# Patient Record
Sex: Female | Born: 1955 | Race: Black or African American | Marital: Single | Smoking: Never smoker
Health system: Southern US, Community
[De-identification: ages and names within clinical notes are randomized; demographics above are authoritative.]

## PROBLEM LIST (undated history)

## (undated) DIAGNOSIS — E119 Type 2 diabetes mellitus without complications: Secondary | ICD-10-CM

## (undated) DIAGNOSIS — I10 Essential (primary) hypertension: Secondary | ICD-10-CM

## (undated) HISTORY — PX: ECTOPIC PREGNANCY SURGERY: SHX613

---

## 2000-11-27 ENCOUNTER — Ambulatory Visit (HOSPITAL_COMMUNITY): Admission: RE | Admit: 2000-11-27 | Discharge: 2000-11-27 | Payer: Self-pay | Admitting: *Deleted

## 2009-06-27 IMAGING — US MAMMO-LUNI-US
1 series · 14 of 16 positions shown · non-contrast
Comparison: NONE

CLINICAL DATA: Kabirov, [facility] Diagnostic Mammogram. 

LEFT BREAST MAMMOGRAM ADDITIONAL VIEWS AND LEFT BREAST ULTRASOUND

[Series 1: us breast · 0.09mm/px · 14 of 17 slices shown]
[im 1/17]
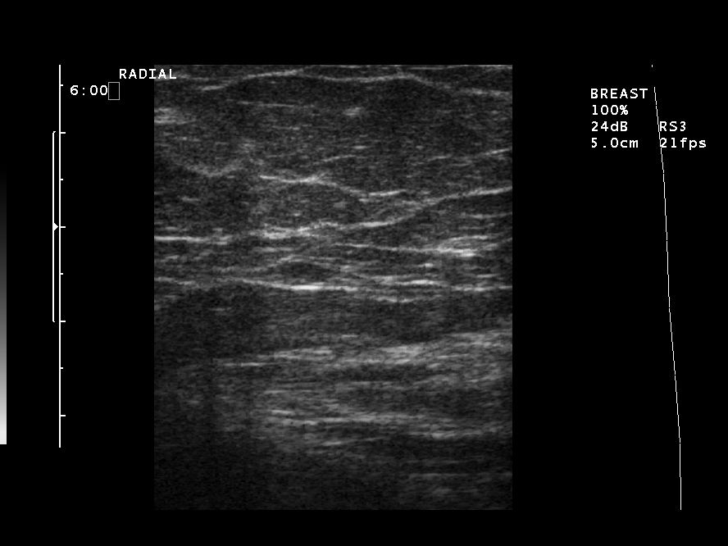
[im 2/17]
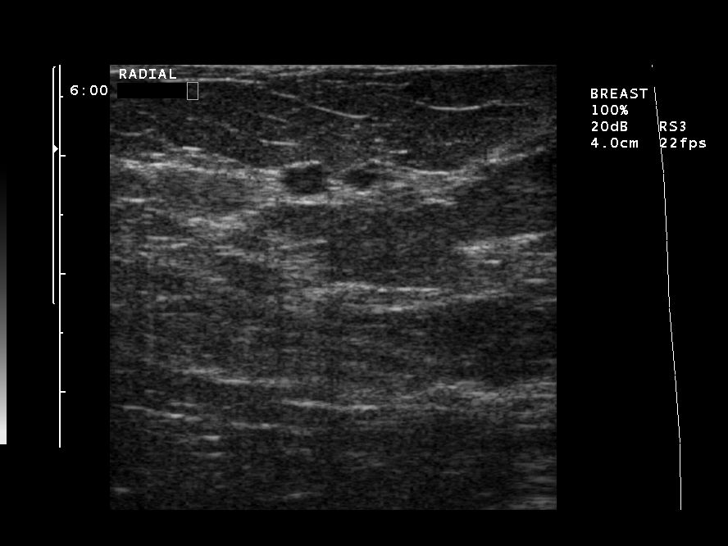
[im 3/17]
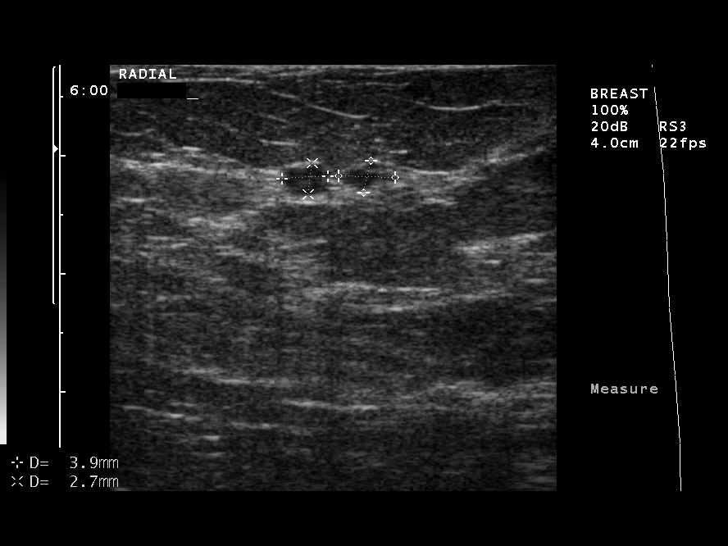
[im 5/17]
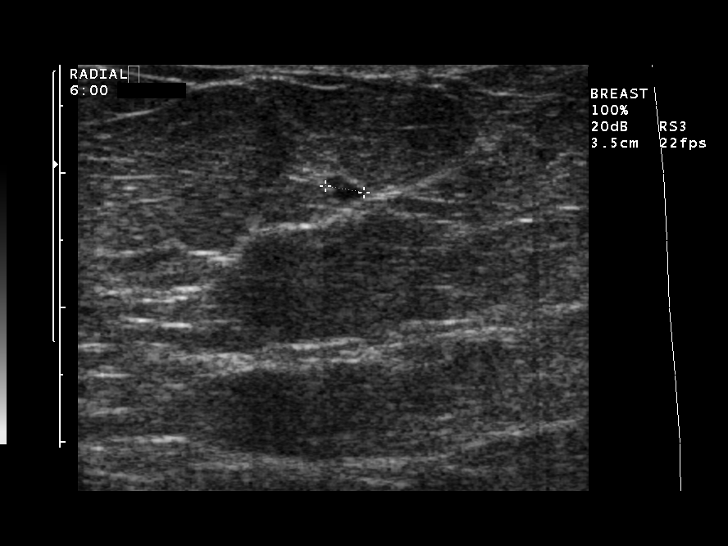
[im 6/17]
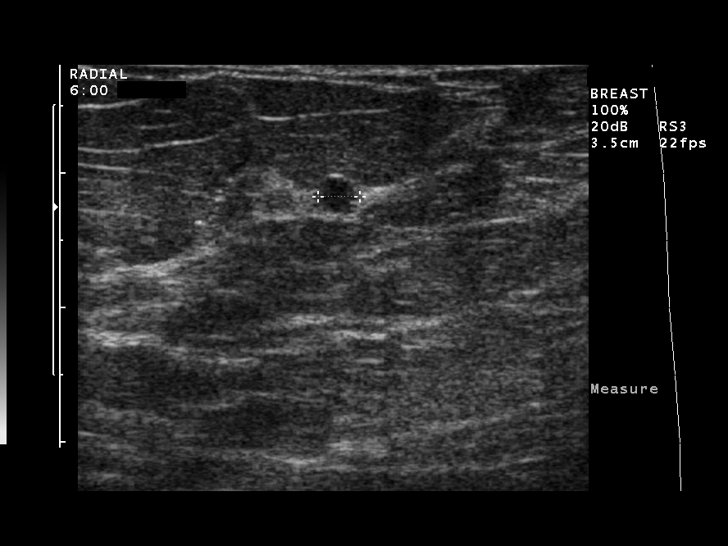
[im 7/17]
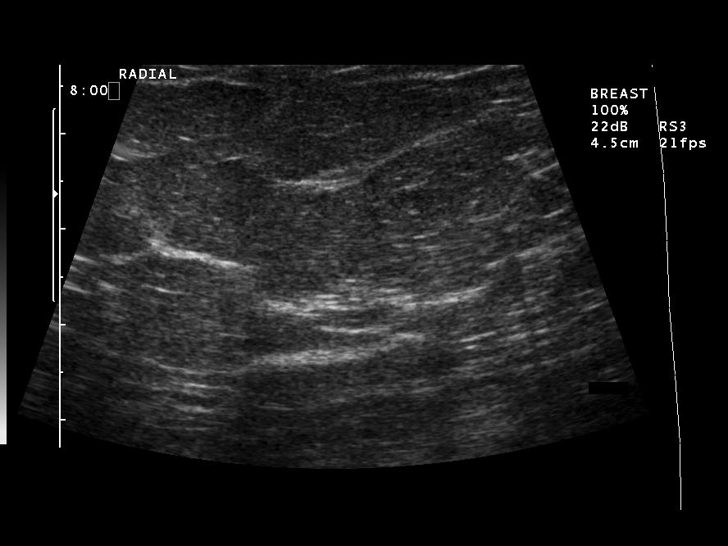
[im 8/17]
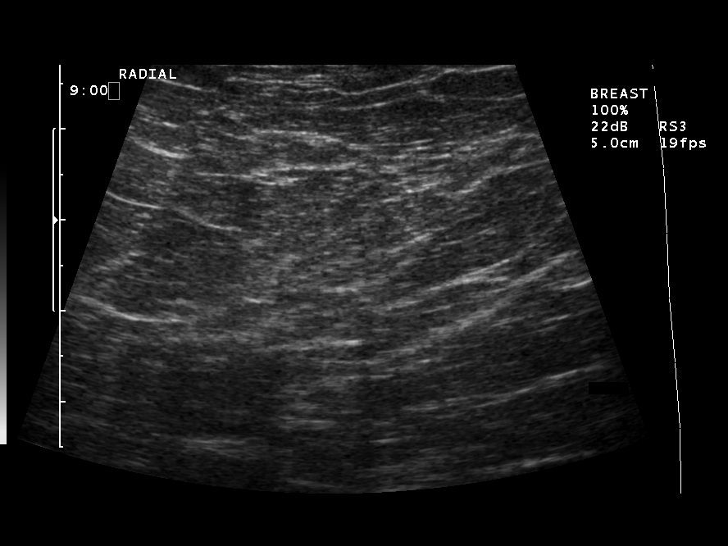
[im 9/17]
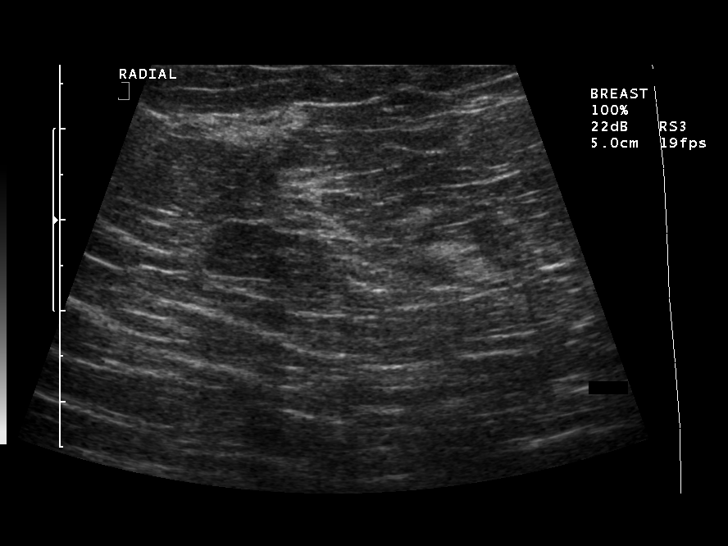
[im 10/17]
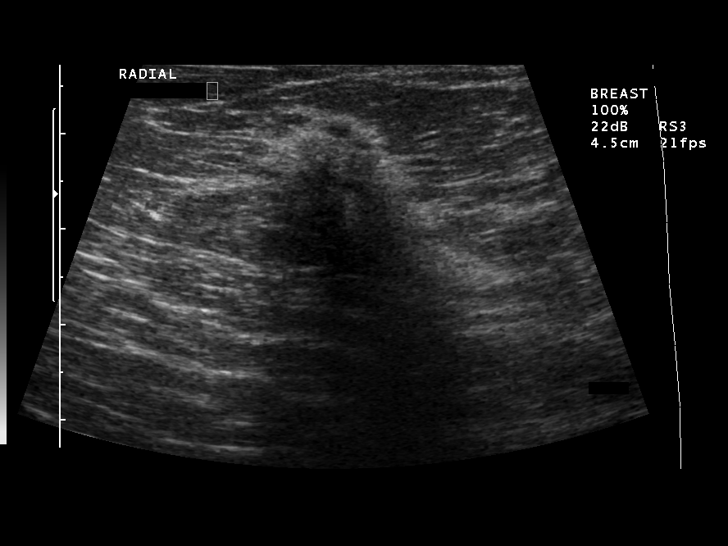
[im 11/17]
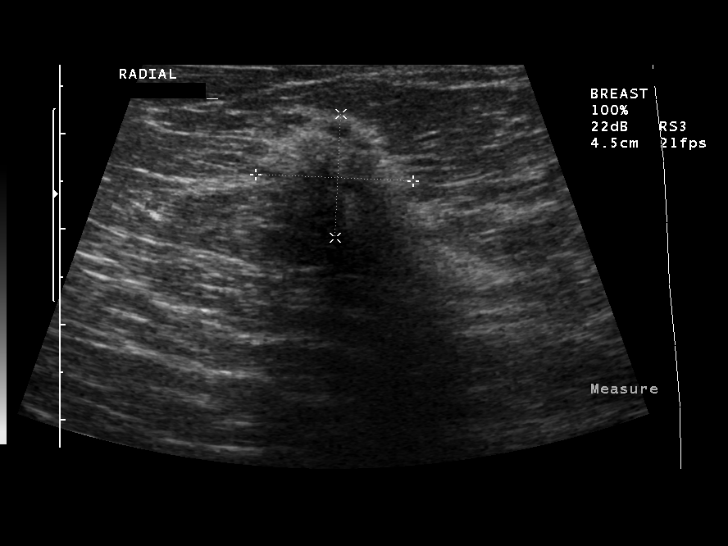
[im 13/17]
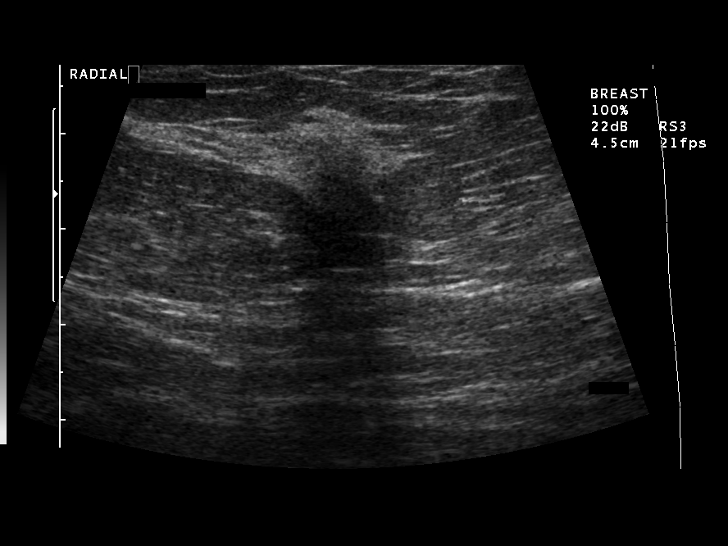
[im 14/17]
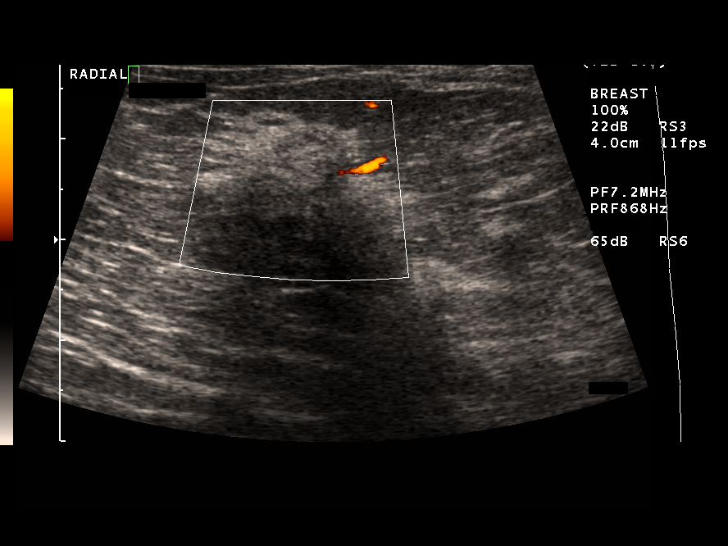
[im 15/17]
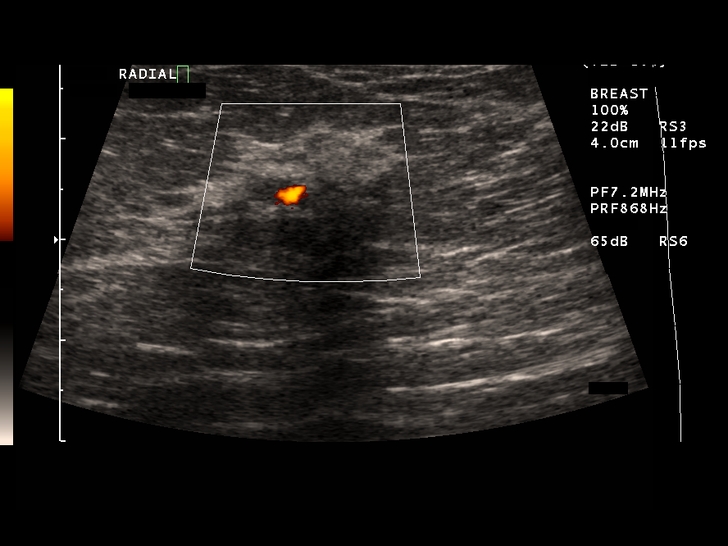
[im 17/17]
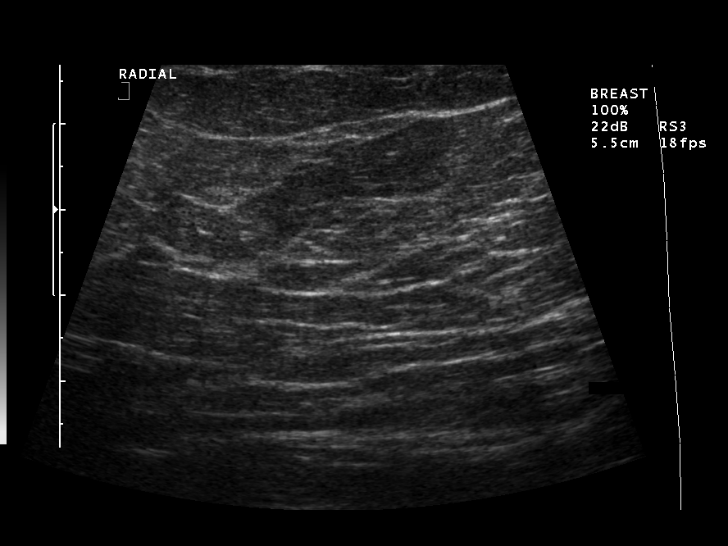

[14 of 16 positions shown; findings below may reference images not displayed]

FINDINGS: Spot compression of the left breast in the CC projection 
and true lateral view of the left breast demonstrates two 
persistent areas of fibroglandular asymmetry both in the upper 
inner quadrant, one anterior and one posterior and slightly more 
lateral. Ultrasound demonstrates a hypoechoic area in the [DATE] 
position suspicious for neoplasm.  It is difficult to ascertain 
the mammographic correlate although it is more likely the more 
anterior and medial of the two lesions.
IMPRESSION: Ultrasound finding suspicious for malignancy.  
Recommend ultrasound-guided biopsy for further evaluation. 
Computer assisted (Second Look) technology was used as an aid in 
interpretation of this study. The patient was informed at the time 
of the examination of the findings and recommendations by verbal 
and written lay report. BI-RADS 4:  Suspicious abnormality ??? 
electronically reviewed on 10/17/2007 Dict Date: 10/17/2007  Tran

## 2010-11-20 ENCOUNTER — Encounter: Payer: Self-pay | Admitting: Internal Medicine

## 2015-04-23 ENCOUNTER — Encounter (HOSPITAL_COMMUNITY): Payer: Self-pay | Admitting: Emergency Medicine

## 2015-04-23 ENCOUNTER — Emergency Department (HOSPITAL_COMMUNITY)
Admission: EM | Admit: 2015-04-23 | Discharge: 2015-04-23 | Disposition: A | Discharge status: Home / Self Care | Payer: No Typology Code available for payment source | Attending: Emergency Medicine | Admitting: Emergency Medicine

## 2015-04-23 ENCOUNTER — Emergency Department (HOSPITAL_COMMUNITY): Payer: No Typology Code available for payment source

## 2015-04-23 DIAGNOSIS — Z79899 Other long term (current) drug therapy: Secondary | ICD-10-CM | POA: Diagnosis not present

## 2015-04-23 DIAGNOSIS — R109 Unspecified abdominal pain: Secondary | ICD-10-CM | POA: Diagnosis not present

## 2015-04-23 DIAGNOSIS — I10 Essential (primary) hypertension: Secondary | ICD-10-CM | POA: Diagnosis not present

## 2015-04-23 DIAGNOSIS — E119 Type 2 diabetes mellitus without complications: Secondary | ICD-10-CM | POA: Insufficient documentation

## 2015-04-23 DIAGNOSIS — R42 Dizziness and giddiness: Secondary | ICD-10-CM | POA: Insufficient documentation

## 2015-04-23 DIAGNOSIS — R112 Nausea with vomiting, unspecified: Secondary | ICD-10-CM | POA: Diagnosis present

## 2015-04-23 HISTORY — DX: Essential (primary) hypertension: I10

## 2015-04-23 HISTORY — DX: Type 2 diabetes mellitus without complications: E11.9

## 2015-04-23 LAB — COMPREHENSIVE METABOLIC PANEL
ALT: 17 U/L (ref 14–54)
AST: 19 U/L (ref 15–41)
Albumin: 4.1 g/dL (ref 3.5–5.0)
Alkaline Phosphatase: 74 U/L (ref 38–126)
Anion gap: 7 (ref 5–15)
BUN: 15 mg/dL (ref 6–20)
CO2: 27 mmol/L (ref 22–32)
Calcium: 10.3 mg/dL (ref 8.9–10.3)
Chloride: 104 mmol/L (ref 101–111)
Creatinine, Ser: 0.9 mg/dL (ref 0.44–1.00)
GFR calc Af Amer: >60 mL/min (ref >60)
GFR calc non Af Amer: >60 mL/min (ref >60)
Glucose, Bld: 126 mg/dL — ABNORMAL HIGH (ref 65–99)
Potassium: 4.4 mmol/L (ref 3.5–5.1)
Sodium: 138 mmol/L (ref 135–145)
Total Bilirubin: 0.7 mg/dL (ref 0.3–1.2)
Total Protein: 7.6 g/dL (ref 6.5–8.1)

## 2015-04-23 LAB — CBC WITH DIFFERENTIAL/PLATELET
Basophils Absolute: 0 10*3/uL (ref 0.0–0.1)
Basophils Relative: 0 % (ref 0–1)
Eosinophils Absolute: 0 10*3/uL (ref 0.0–0.7)
Eosinophils Relative: 1 % (ref 0–5)
HCT: 38.8 % (ref 36.0–46.0)
Hemoglobin: 12.6 g/dL (ref 12.0–15.0)
Lymphocytes Relative: 29 % (ref 12–46)
Lymphs Abs: 1.7 10*3/uL (ref 0.7–4.0)
MCH: 29.9 pg (ref 26.0–34.0)
MCHC: 32.5 g/dL (ref 30.0–36.0)
MCV: 91.9 fL (ref 78.0–100.0)
Monocytes Absolute: 0.4 10*3/uL (ref 0.1–1.0)
Monocytes Relative: 6 % (ref 3–12)
Neutro Abs: 3.9 10*3/uL (ref 1.7–7.7)
Neutrophils Relative %: 64 % (ref 43–77)
Platelets: 242 10*3/uL (ref 150–400)
RBC: 4.22 MIL/uL (ref 3.87–5.11)
RDW: 13.7 % (ref 11.5–15.5)
WBC: 6 10*3/uL (ref 4.0–10.5)

## 2015-04-23 LAB — LIPASE, BLOOD: LIPASE: 22 U/L (ref 22–51)

## 2015-04-23 LAB — URINALYSIS, ROUTINE W REFLEX MICROSCOPIC
Bilirubin Urine: NEGATIVE
GLUCOSE, UA: NEGATIVE mg/dL
HGB URINE DIPSTICK: NEGATIVE
Ketones, ur: NEGATIVE mg/dL
LEUKOCYTES UA: NEGATIVE
Nitrite: NEGATIVE
PH: 7 (ref 5.0–8.0)
Protein, ur: NEGATIVE mg/dL
SPECIFIC GRAVITY, URINE: 1.018 (ref 1.005–1.030)
Urobilinogen, UA: 0.2 mg/dL (ref 0.0–1.0)

## 2015-04-23 MED ORDER — MECLIZINE HCL 25 MG PO TABS
25.0000 mg | ORAL_TABLET | Freq: Once | ORAL | Status: AC
  Administered 2015-04-23: 25 mg via ORAL
  Filled 2015-04-23: qty 1

## 2015-04-23 MED ORDER — SODIUM CHLORIDE 0.9 % IV BOLUS (SEPSIS)
1000.0000 mL | Freq: Once | INTRAVENOUS | Status: AC
  Administered 2015-04-23: 1000 mL via INTRAVENOUS

## 2015-04-23 MED ORDER — MECLIZINE HCL 50 MG PO TABS: 25.0000 mg | ORAL_TABLET | Freq: Three times a day (TID) | ORAL | Status: DC | PRN

## 2015-04-23 MED ORDER — PROMETHAZINE HCL 25 MG PO TABS: 25.0000 mg | ORAL_TABLET | Freq: Four times a day (QID) | ORAL | Status: DC | PRN

## 2015-04-23 MED ORDER — LORAZEPAM 1 MG PO TABS
1.0000 mg | ORAL_TABLET | Freq: Once | ORAL | Status: AC
  Administered 2015-04-23: 1 mg via ORAL
  Filled 2015-04-23: qty 1

## 2015-04-23 NOTE — ED Notes (Signed)
Pt reports dizziness, sts "the whole room is spinning", also sts she had n/v earlier today which has now resolved. Pt denies pain.

## 2015-04-23 NOTE — ED Notes (Signed)
Per EMS: Pt from home. C/o NV when she moves her head back and forth.

## 2015-04-23 NOTE — ED Provider Notes (Signed)
CSN: [id number]     Arrival date & time 04/23/15  1413 History   First MD Initiated Contact with Patient 04/23/15 1739     Chief Complaint  Patient presents with  . Abdominal Pain  . Emesis     (Consider location/radiation/quality/duration/timing/severity/associated sxs/prior Treatment) HPI..... Sense of room spinning earlier today with associated nausea. No other neurological deficits. No extremity weakness or numbness. No facial asymmetry. Normal mentation and conversation. No fever, chills, stiff neck. Severity is moderate. Movement makes symptoms worse. Patient is diabetic and takes oral hypoglycemics  Past Medical History  Diagnosis Date  . Diabetes mellitus without complication   . Hypertension    No past surgical history on file. No family history on file. History  Substance Use Topics  . Smoking status: Never Smoker   . Smokeless tobacco: Not on file  . Alcohol Use: No   OB History    No data available     Review of Systems  All other systems reviewed and are negative.     Allergies  Review of patient's allergies indicates no known allergies.  Home Medications   Prior to Admission medications   Medication Sig Start Date End Date Taking? Authorizing Provider  allopurinol (ZYLOPRIM) 300 MG tablet Take 1 tablet by mouth daily. 02/25/15  Yes Historical Provider, MD  HYDROcodone-acetaminophen (NORCO) 7.5-325 MG per tablet Take 1 tablet by mouth 2 (two) times daily as needed. pain 03/09/15  Yes Historical Provider, MD  ibuprofen (ADVIL,MOTRIN) 200 MG tablet Take 200-400 mg by mouth every 6 (six) hours as needed for moderate pain.   Yes Historical Provider, MD  metFORMIN (GLUCOPHAGE) 500 MG tablet Take 1 tablet by mouth daily. 02/25/15  Yes Historical Provider, MD  pantoprazole (PROTONIX) 40 MG tablet Take 1 tablet by mouth daily. 03/09/15  Yes Historical Provider, MD  TRIBENZOR 40-5-25 MG TABS Take 1 tablet by mouth daily. 03/11/15  Yes Historical Provider, MD  CRESTOR  40 MG tablet Take 1 tablet by mouth daily. 03/09/15   Historical Provider, MD  meclizine (ANTIVERT) 50 MG tablet Take 0.5 tablets (25 mg total) by mouth 3 (three) times daily as needed for dizziness or nausea. 04/23/15   Nat Christen, MD  promethazine (PHENERGAN) 25 MG tablet Take 1 tablet (25 mg total) by mouth every 6 (six) hours as needed for nausea or vomiting. 04/23/15   Nat Christen, MD   BP 136/69 mmHg  Pulse 65  Temp(Src) 98.5 F (36.9 C) (Oral)  Resp 18  SpO2 100% Physical Exam  Constitutional: She is oriented to person, place, and time. She appears well-developed and well-nourished.  HENT:  Head: Normocephalic and atraumatic.  Eyes: Conjunctivae and EOM are normal. Pupils are equal, round, and reactive to light.  Neck: Normal range of motion. Neck supple.  Cardiovascular: Normal rate and regular rhythm.   Pulmonary/Chest: Effort normal and breath sounds normal.  Abdominal: Soft. Bowel sounds are normal.  Musculoskeletal: Normal range of motion.  Neurological: She is alert and oriented to person, place, and time.  Skin: Skin is warm and dry.  Psychiatric: She has a normal mood and affect. Her behavior is normal.  Nursing note and vitals reviewed.   ED Course  Procedures (including critical care time) Labs Review Labs Reviewed  COMPREHENSIVE METABOLIC PANEL - Abnormal; Notable for the following:    Glucose, Bld 126 (*)    All other components within normal limits  CBC WITH DIFFERENTIAL/PLATELET  LIPASE, BLOOD  URINALYSIS, ROUTINE W REFLEX MICROSCOPIC (NOT AT [facility])  Imaging Review Ct Head Wo Contrast  04/23/2015   CLINICAL DATA:  Dizziness.  EXAM: CT HEAD WITHOUT CONTRAST  TECHNIQUE: Contiguous axial images were obtained from the base of the skull through the vertex without intravenous contrast.  COMPARISON:  None.  FINDINGS: No acute cortical infarct, hemorrhage, or mass lesion ispresent. Ventricles are of normal size. No significant extra-axial fluid collection is present.  The paranasal sinuses andmastoid air cells are clear. The osseous skull is intact.  IMPRESSION: 1. No acute intracranial abnormalities.  Normal brain.   Electronically Signed   By: Kerby Moors M.D.   On: 04/23/2015 20:18     EKG Interpretation None      MDM   Final diagnoses:  Vertigo    Patient appears to have a normal physical exam.  History consistent with vertigo. She feels better after IV fluids, Ativan, meclizine. Discharge medications Phenergan 25 mg and meclizine 25 mg    Nat Christen, MD 04/23/15 2253

## 2015-04-23 NOTE — Discharge Instructions (Signed)
Medication for dizziness and nausea. Increase fluids. Rest. Follow-up your primary care doctor.

## 2017-01-03 IMAGING — CT CT HEAD W/O CM
2 series · 17 of 30 positions shown, 20 images · non-contrast
Comparison: None.

CLINICAL DATA: Dizziness.

EXAM:
CT HEAD WITHOUT CONTRAST
TECHNIQUE: Contiguous axial images were obtained from the base of the skull
through the vertex without intravenous contrast.

[Series 2: head w/o · axial · non-contrast · 0.38mm/px · z∈[+617,+737]mm · 9 of 31 slices shown, 12 images]
[im 4/31  brain]
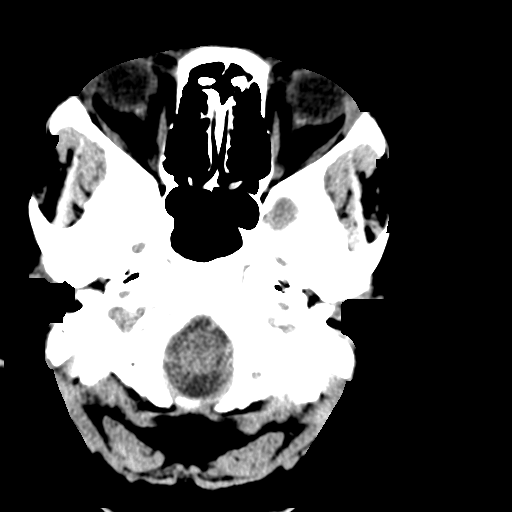
[im 4/31  bone]
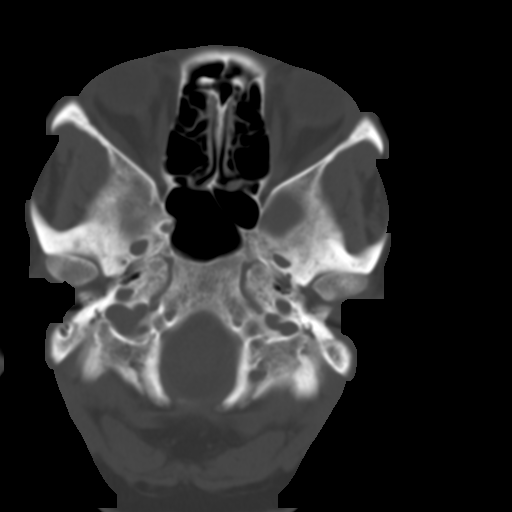
[im 7/31  brain]
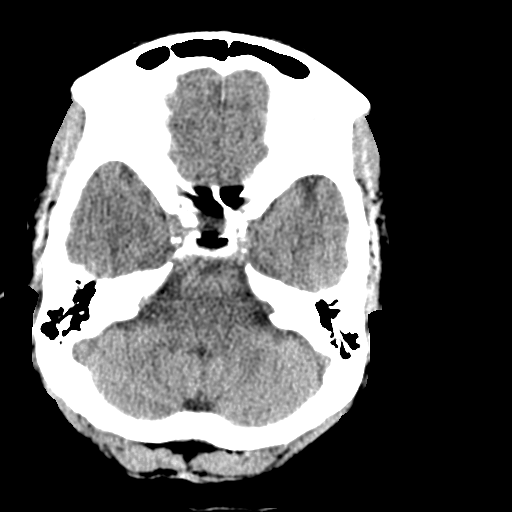
[im 10/31  brain]
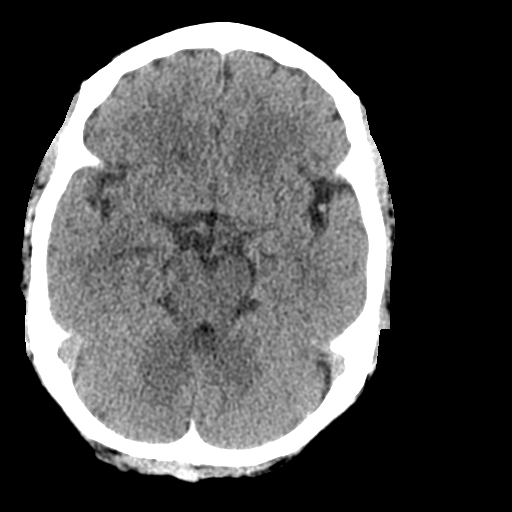
[im 13/31  brain]
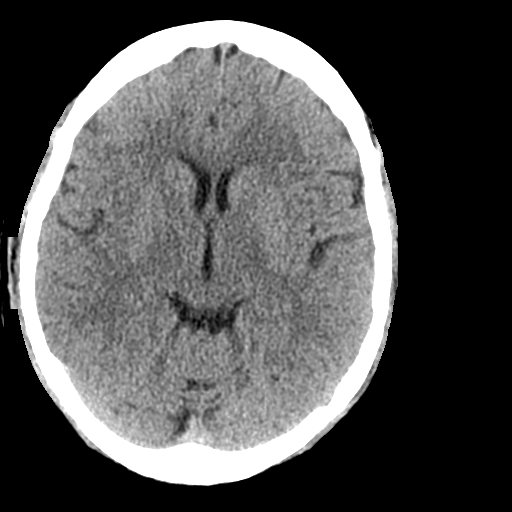
[im 16/31  brain]
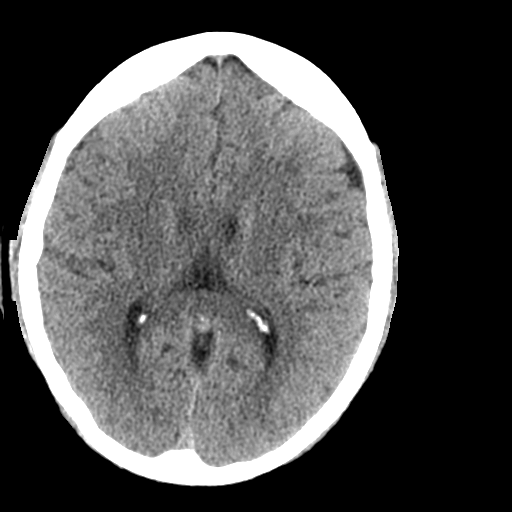
[im 16/31  bone]
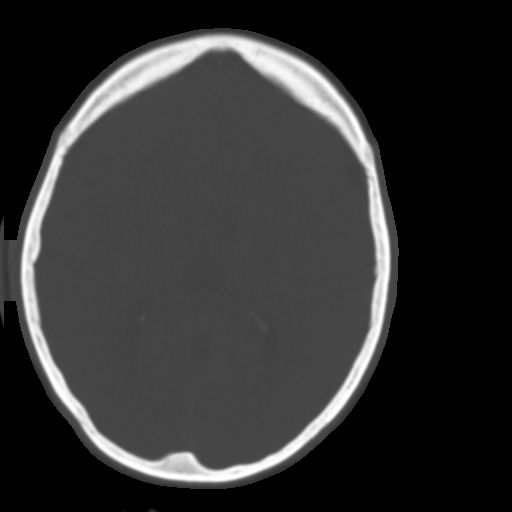
[im 19/31  brain]
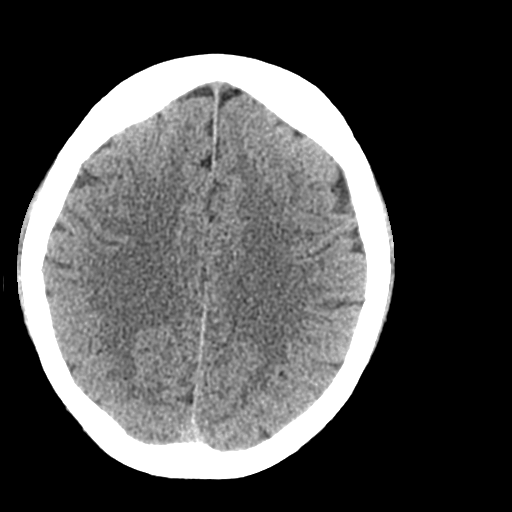
[im 22/31  brain]
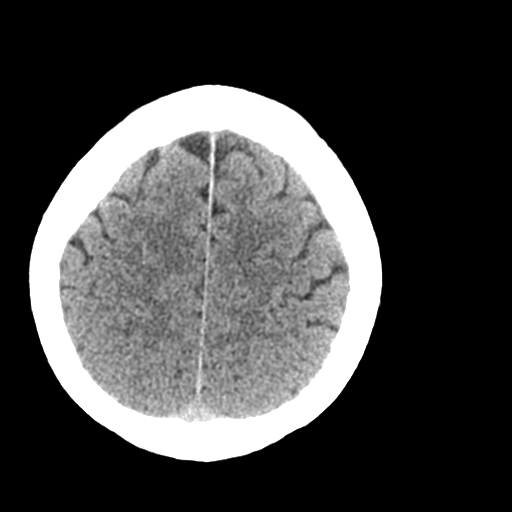
[im 25/31  brain]
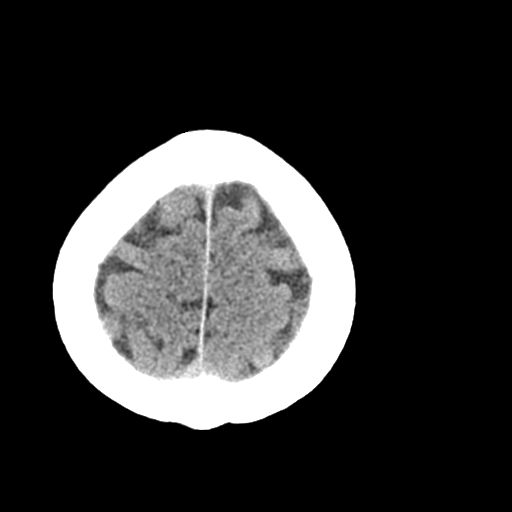
[im 28/31  brain]
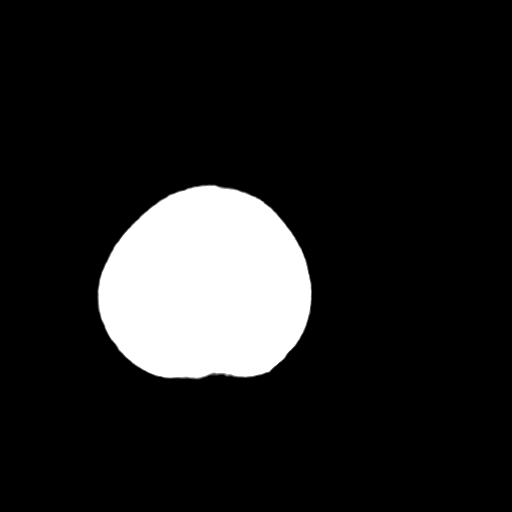
[im 28/31  bone]
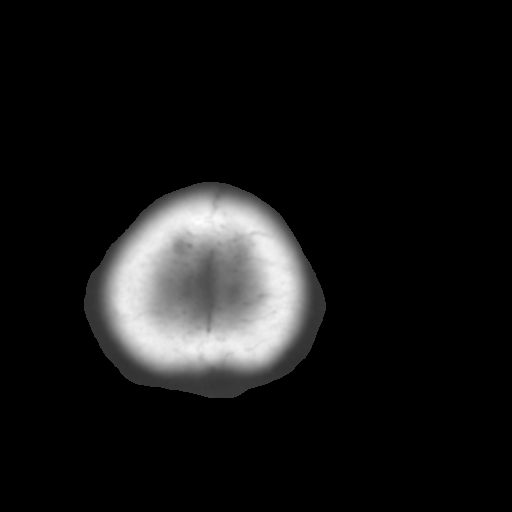

[Series 3: bone windows · axial · 0.38mm/px · z∈[+617,+734]mm · 8 of 51 slices shown]
[im 6/51  bone]
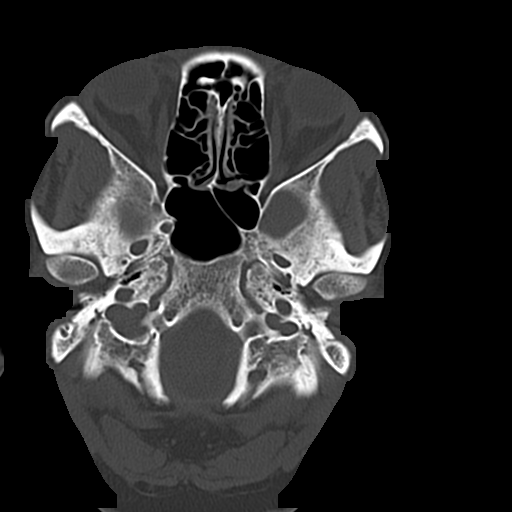
[im 12/51  bone]
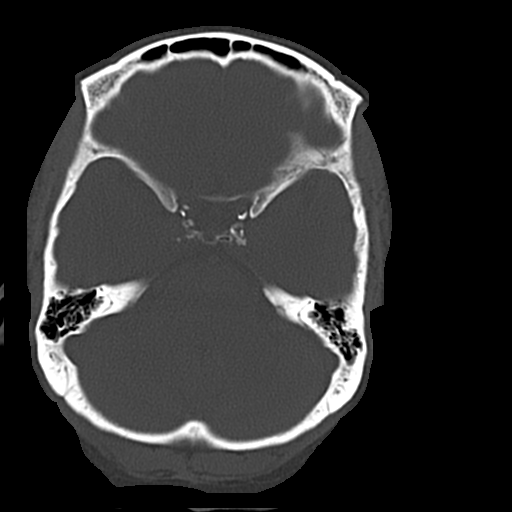
[im 17/51  bone]
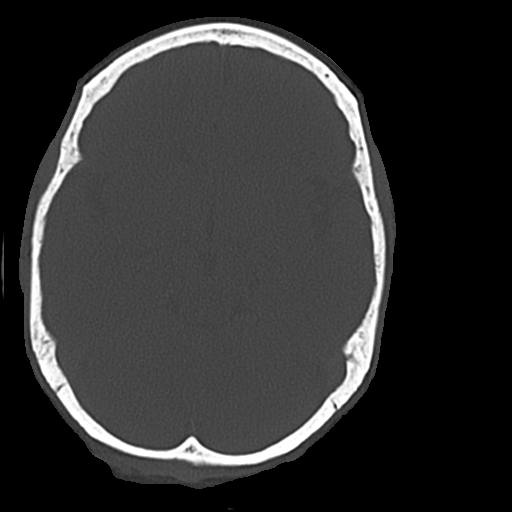
[im 23/51  bone]
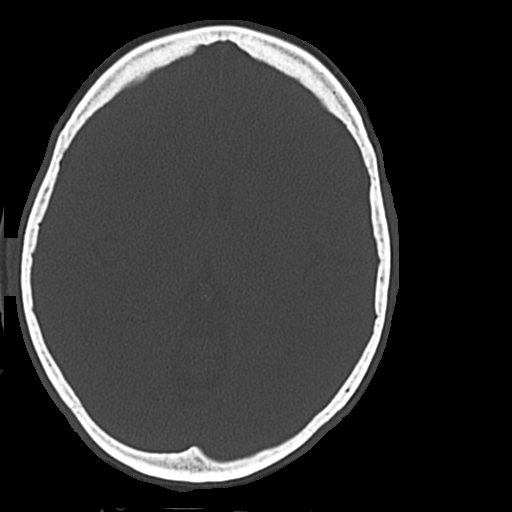
[im 28/51  bone]
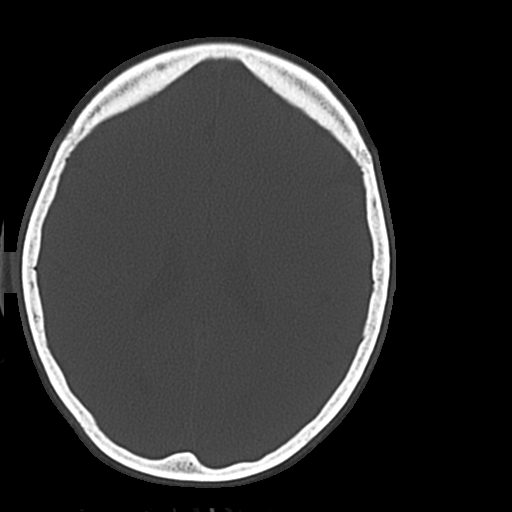
[im 34/51  bone]
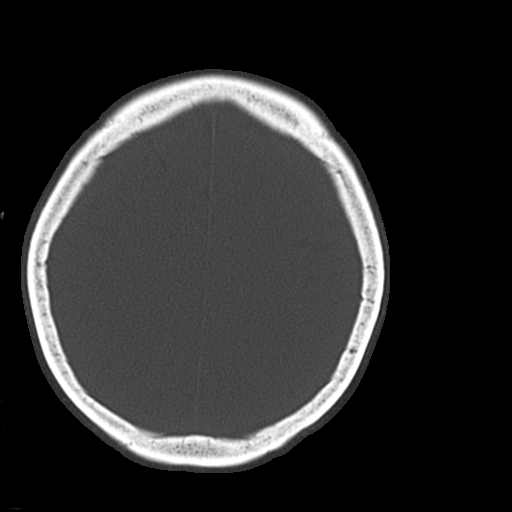
[im 39/51  bone]
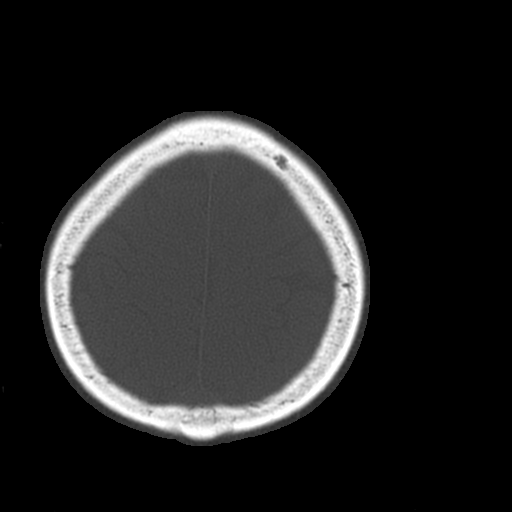
[im 45/51  bone]
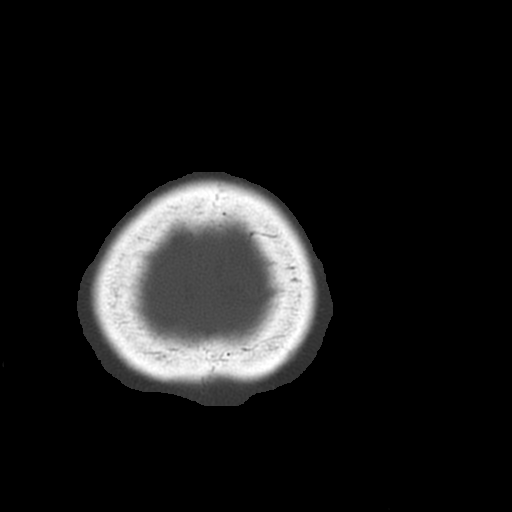

[17 of 30 positions shown; findings below may reference images not displayed]

FINDINGS: No acute cortical infarct, hemorrhage, or mass lesion ispresent.
Ventricles are of normal size. No significant extra-axial fluid
collection is present. The paranasal sinuses andmastoid air cells
are clear. The osseous skull is intact.
IMPRESSION: 1. No acute intracranial abnormalities.  Normal brain.

## 2020-05-05 ENCOUNTER — Ambulatory Visit
Admission: EM | Admit: 2020-05-05 | Discharge: 2020-05-05 | Disposition: A | Discharge status: Home / Self Care | Payer: 59

## 2020-05-05 ENCOUNTER — Other Ambulatory Visit: Payer: Self-pay

## 2020-05-05 DIAGNOSIS — M7989 Other specified soft tissue disorders: Secondary | ICD-10-CM | POA: Diagnosis not present

## 2020-05-05 LAB — POCT URINALYSIS DIP (MANUAL ENTRY)
Bilirubin, UA: NEGATIVE
Blood, UA: NEGATIVE
Glucose, UA: NEGATIVE mg/dL
Ketones, POC UA: NEGATIVE mg/dL
Leukocytes, UA: NEGATIVE
Nitrite, UA: NEGATIVE
Protein Ur, POC: NEGATIVE mg/dL
Spec Grav, UA: 1.015 (ref 1.010–1.025)
Urobilinogen, UA: 0.2 E.U./dL
pH, UA: 6.5 (ref 5.0–8.0)

## 2020-05-05 NOTE — ED Triage Notes (Signed)
Pt c/o bilateral feet swelling for over 3 months. States has been on fluid pills since May with no relief. States swelling is better in the mornings.

## 2020-05-05 NOTE — ED Provider Notes (Signed)
[facility]    CSN: [id number] Arrival date & time: 05/05/20  0940      History   Chief Complaint Chief Complaint  Patient presents with  . Foot Swelling    HPI Kelly Hurst is a 64 y.o. female with history of diabetes, hypertension presenting for 67-month course of bilateral foot swelling.  Denies injury, trauma, rash.  States that her primary care started her on Lasix without relief.  Patient denies increased dyspnea, chest pain, palpitations, fever.  Has tried compression stockings intermittently with some relief.  No numbness, tingling, deformity.  Denies recent change in medication or diet.   Past Medical History:  Diagnosis Date  . Diabetes mellitus without complication ([place])   . Hypertension     There are no problems to display for this patient.   History reviewed. No pertinent surgical history.  OB History   No obstetric history on file.      Home Medications    Prior to Admission medications   Medication Sig Start Date End Date Taking? Authorizing Provider  aspirin EC 81 MG tablet Take 81 mg by mouth daily. Swallow whole.   Yes [provider]  atorvastatin (LIPITOR) 20 MG tablet Take 20 mg by mouth daily.   Yes [provider]  diclofenac (VOLTAREN) 75 MG EC tablet Take 75 mg by mouth 2 (two) times daily.   Yes [provider]  furosemide (LASIX) 20 MG tablet Take 20 mg by mouth daily.   Yes [provider]  gabapentin (NEURONTIN) 100 MG capsule Take 100 mg by mouth 3 (three) times daily.   Yes [provider]  Olmesartan-amLODIPine-HCTZ 40-10-25 MG TABS Take by mouth.   Yes [provider]  tiZANidine (ZANAFLEX) 4 MG tablet Take 4 mg by mouth 3 (three) times daily.   Yes [provider]    Family History History reviewed. No pertinent family history.  Social History Social History   Tobacco Use  . Smoking status: Never Smoker  . Smokeless tobacco: Never Used  Substance  Use Topics  . Alcohol use: No  . Drug use: No     Allergies   Patient has no known allergies.   Review of Systems As per HPI   Physical Exam Triage Vital Signs ED Triage Vitals  Enc Vitals Group     BP      Pulse      Resp      Temp      Temp src      SpO2      Weight      Height      Head Circumference      Peak Flow      Pain Score      Pain Loc      Pain Edu?      Excl. in [place]?    No data found.  Updated Vital Signs BP (!) 175/99 (BP Location: Left Arm)   Pulse 76   Temp 98.5 F (36.9 C) (Oral)   Resp 18   SpO2 97%   Visual Acuity Right Eye Distance:   Left Eye Distance:   Bilateral Distance:    Right Eye Near:   Left Eye Near:    Bilateral Near:     Physical Exam Constitutional:      General: She is not in acute distress. HENT:     Head: Normocephalic and atraumatic.     Mouth/Throat:     Mouth: Mucous membranes are moist.  Pharynx: Oropharynx is clear.  Eyes:     General: No scleral icterus.    Pupils: Pupils are equal, round, and reactive to light.  Neck:     Comments: Trachea midline, negative JVD Cardiovascular:     Rate and Rhythm: Normal rate and regular rhythm.  Pulmonary:     Effort: Pulmonary effort is normal. No respiratory distress.     Breath sounds: No wheezing.  Musculoskeletal:        General: Swelling present. No tenderness. Normal range of motion.     Cervical back: Neck supple.     Right lower leg: No edema.     Left lower leg: No edema.     Comments: Nonpitting edema noted to bilateral feet.  No open wounds, rash.  Lymphadenopathy:     Cervical: No cervical adenopathy.  Skin:    Capillary Refill: Capillary refill takes less than 2 seconds.     Coloration: Skin is not jaundiced or pale.     Findings: No erythema.     Comments: No venous stasis dermatitis, pallor  Neurological:     General: No focal deficit present.     Mental Status: She is alert and oriented to person, place, and time.      UC  Treatments / Results  Labs (all labs ordered are listed, but only abnormal results are displayed) Labs Reviewed  POCT URINALYSIS DIP (MANUAL ENTRY) - Normal    EKG   Radiology No results found.  Procedures Procedures (including critical care time)  Medications Ordered in UC Medications - No data to display  Initial Impression / Assessment and Plan / UC Course  I have reviewed the triage vital signs and the nursing notes.  Pertinent labs & imaging results that were available during my care of the patient were reviewed by me and considered in my medical decision making (see chart for details).     Patient afebrile, nontoxic in office today.  Reassuring cardiopulmonary exam.  Patient unsure of last A1c, cannot review external records in patient's chart, though denies signs/symptoms of poor glycemic control.  Urine dipstick unremarkable.  Low concern for AKI at this time.  Patient does appear well-hydrated.  Will provide number for vascular office as patient likely has some component of PVD given age, comorbidities, which could be contributory.  Return precautions discussed, patient verbalized understanding and is agreeable to plan. Final Clinical Impressions(s) / UC Diagnoses   Final diagnoses:  Foot swelling     Discharge Instructions     No change in medications at this time. Please keep track of symptoms on a log and follow-up with your primary care. You may need to be evaluated by a vascular doctor (see phone number below) Important to wear your compression stockings for the time you wake up until when you go to bed. Go to ER for worsening swelling, pain, injury, difficulty breathing, chest pain.    ED Prescriptions    None     PDMP not reviewed this encounter.   Hall-Potvin, [place], [place] 05/05/20 1305

## 2020-05-05 NOTE — Discharge Instructions (Signed)
No change in medications at this time. Please keep track of symptoms on a log and follow-up with your primary care. You may need to be evaluated by a vascular doctor (see phone number below) Important to wear your compression stockings for the time you wake up until when you go to bed. Go to ER for worsening swelling, pain, injury, difficulty breathing, chest pain.

## 2020-07-13 ENCOUNTER — Other Ambulatory Visit: Payer: Self-pay

## 2020-07-13 DIAGNOSIS — I739 Peripheral vascular disease, unspecified: Secondary | ICD-10-CM

## 2020-07-13 NOTE — Addendum Note (Signed)
Addended byDoylene Bode on: 07/13/2020 03:36 PM   Modules accepted: Orders

## 2020-07-13 NOTE — Progress Notes (Signed)
Opened In Error

## 2020-07-28 ENCOUNTER — Other Ambulatory Visit: Payer: Self-pay

## 2020-07-28 ENCOUNTER — Ambulatory Visit (HOSPITAL_COMMUNITY)
Admission: RE | Admit: 2020-07-28 | Discharge: 2020-07-28 | Disposition: A | Discharge status: Home / Self Care | Payer: 59 | Source: Ambulatory Visit | Attending: Vascular Surgery | Admitting: Vascular Surgery

## 2020-07-28 ENCOUNTER — Encounter: Payer: Self-pay | Admitting: Vascular Surgery

## 2020-07-28 ENCOUNTER — Ambulatory Visit (INDEPENDENT_AMBULATORY_CARE_PROVIDER_SITE_OTHER): Payer: 59 | Admitting: Vascular Surgery

## 2020-07-28 VITALS — BP 139/84 | HR 64 | Temp 98.0°F | Resp 20 | Ht 68.0 in | Wt 209.0 lb

## 2020-07-28 DIAGNOSIS — I872 Venous insufficiency (chronic) (peripheral): Secondary | ICD-10-CM

## 2020-07-28 DIAGNOSIS — I739 Peripheral vascular disease, unspecified: Secondary | ICD-10-CM | POA: Insufficient documentation

## 2020-07-28 NOTE — Progress Notes (Signed)
REASON FOR CONSULT:    Peripheral vascular disease and foot swelling.  The consult is requested by [place] Hall-Potvin, [facility]  ASSESSMENT & PLAN:   TIBIAL ARTERY OCCLUSIVE DISEASE: Based on her exam she does have evidence of some mild tibial artery occlusive disease on the left.  However, this would not explain her symptoms.  She has significant back pain, hip and thigh pain.  I suspect a lot of her symptoms in her legs is related to her degenerative disc disease in her back.  She has palpable femoral and popliteal pulses bilaterally.  We have discussed the importance of exercise and nutrition.  Fortunately she is not a smoker.  I certainly would not recommend any further arterial work-up unless she developed worsening claudication, rest pain, or nonhealing ulcer.  CHRONIC VENOUS INSUFFICIENCY: She does have evidence of deep venous reflux bilaterally.  This may be contributing to her leg swelling.  We have discussed the importance of intermittent leg elevation the proper positioning for this.  In addition I have encouraged her to avoid prolonged sitting and standing.  I encouraged her to continue to wear her compression stockings.  We also discussed the importance of exercise.  I be happy to see her back in the future for her venous symptoms progress.  However, at this point no further vascular work-up is indicated.   Deitra Mayo, MD Office: [phone]   HPI:   Kelly Hurst is a pleasant 64 y.o. female, who was referred for evaluation of foot swelling and peripheral vascular disease.  I reviewed the records from the visit to urgent care on 05/05/2020.  The patient complained of foot swelling.  This is been going on for 3 months.  This was bilateral foot swelling.   On my history the patient describes the gradual onset of swelling in both feet about 3 months ago.  She also describes pain in her back, hips, thighs, and calves bilaterally.  Her symptoms are worse on the right side.  She  occasionally gets the symptoms at night.  Her symptoms are worsened by activity.  I do not get any history of rest pain or nonhealing ulcers.  Her risk factors for peripheral vascular disease include type 2 diabetes, hypertension, and hypercholesterolemia.  She denies any family history of premature cardiovascular disease or tobacco use.  She is had no previous history of DVT and no previous venous procedures.  She does elevate her legs which helps her symptoms some.  She also wears compression stockings which help her symptoms some.   Past Medical History:  Diagnosis Date  . Diabetes mellitus without complication ([place])   . Hypertension     History reviewed. No pertinent family history.  SOCIAL HISTORY: Social History   Socioeconomic History  . Marital status: Single    Spouse name: Not on file  . Number of children: Not on file  . Years of education: Not on file  . Highest education level: Not on file  Occupational History  . Not on file  Tobacco Use  . Smoking status: Never Smoker  . Smokeless tobacco: Never Used  Vaping Use  . Vaping Use: Never used  Substance and Sexual Activity  . Alcohol use: No  . Drug use: No  . Sexual activity: Not on file  Other Topics Concern  . Not on file  Social History Narrative  . Not on file   Social Determinants of Health   Financial Resource Strain:   . Difficulty of Paying Living Expenses:  Not on file  Food Insecurity:   . Worried About Charity fundraiser in the Last Year: Not on file  . Ran Out of Food in the Last Year: Not on file  Transportation Needs:   . Lack of Transportation (Medical): Not on file  . Lack of Transportation (Non-Medical): Not on file  Physical Activity:   . Days of Exercise per Week: Not on file  . Minutes of Exercise per Session: Not on file  Stress:   . Feeling of Stress : Not on file  Social Connections:   . Frequency of Communication with Friends and Family: Not on file  . Frequency of Social  Gatherings with Friends and Family: Not on file  . Attends Religious Services: Not on file  . Active Member of Clubs or Organizations: Not on file  . Attends Archivist Meetings: Not on file  . Marital Status: Not on file  Intimate Partner Violence:   . Fear of Current or Ex-Partner: Not on file  . Emotionally Abused: Not on file  . Physically Abused: Not on file  . Sexually Abused: Not on file    No Known Allergies  Current Outpatient Medications  Medication Sig Dispense Refill  . aspirin EC 81 MG tablet Take 81 mg by mouth daily. Swallow whole.    Marland Kitchen atorvastatin (LIPITOR) 20 MG tablet Take 20 mg by mouth daily.    . diclofenac (VOLTAREN) 75 MG EC tablet Take 75 mg by mouth 2 (two) times daily.    . furosemide (LASIX) 20 MG tablet Take 20 mg by mouth daily.    Marland Kitchen gabapentin (NEURONTIN) 100 MG capsule Take 100 mg by mouth 3 (three) times daily.    . Olmesartan-amLODIPine-HCTZ 40-10-25 MG TABS Take by mouth.    Marland Kitchen tiZANidine (ZANAFLEX) 4 MG tablet Take 4 mg by mouth 3 (three) times daily.     No current facility-administered medications for this visit.    REVIEW OF SYSTEMS:  [X]  denotes positive finding, [ ]  denotes negative finding Cardiac  Comments:  Chest pain or chest pressure:    Shortness of breath upon exertion:    Short of breath when lying flat:    Irregular heart rhythm:        Vascular    Pain in calf, thigh, or hip brought on by ambulation:    Pain in feet at night that wakes you up from your sleep:     Blood clot in your veins:    Leg swelling:  x       Pulmonary    Oxygen at home:    Productive cough:     Wheezing:         Neurologic    Sudden weakness in arms or legs:     Sudden numbness in arms or legs:     Sudden onset of difficulty speaking or slurred speech:    Temporary loss of vision in one eye:     Problems with dizziness:         Gastrointestinal    Blood in stool:     Vomited blood:         Genitourinary    Burning when  urinating:     Blood in urine:        Psychiatric    Major depression:         Hematologic    Bleeding problems:    Problems with blood clotting too easily:        Skin  Rashes or ulcers:        Constitutional    Fever or chills:     PHYSICAL EXAM:   Vitals:   07/28/20 1327  BP: 139/84  Pulse: 64  Resp: 20  Temp: 98 F (36.7 C)  SpO2: 98%  Weight: 209 lb (94.8 kg)  Height: 5\' 8"  (1.727 m)    GENERAL: The patient is a well-nourished female, in no acute distress. The vital signs are documented above. CARDIAC: There is a regular rate and rhythm.  VASCULAR: I do not detect carotid bruits. On the right side she has a palpable femoral and popliteal pulse. I cannot palpate pedal pulses however she has a biphasic dorsalis pedis and posterior tibial signal with a Doppler. On the left side she has a palpable femoral and popliteal pulse. I cannot palpate pedal pulses. He has a biphasic posterior tibial signal with a Doppler and a biphasic anterior tibial signal with a Doppler but a monophasic dorsalis pedis signal. She has mild bilateral lower extremity swelling. She has some spider veins bilaterally. PULMONARY: There is good air exchange bilaterally without wheezing or rales. ABDOMEN: Soft and non-tender with normal pitched bowel sounds.  MUSCULOSKELETAL: There are no major deformities or cyanosis. NEUROLOGIC: No focal weakness or paresthesias are detected. SKIN: There are no ulcers or rashes noted. PSYCHIATRIC: The patient has a normal affect.  DATA:    VENOUS DUPLEX: I have independently interpreted her venous duplex scan today.  On the right side there is no evidence of DVT or superficial venous thrombosis. There is deep venous reflux involving the common femoral vein. There is no significant superficial venous reflux.  On the left side there is no evidence of DVT or superficial venous thrombosis. There is deep venous reflux involving the common femoral vein. There is no  significant superficial venous reflux.

## 2022-08-09 DIAGNOSIS — Z0001 Encounter for general adult medical examination with abnormal findings: Secondary | ICD-10-CM | POA: Diagnosis not present

## 2022-08-09 DIAGNOSIS — I10 Essential (primary) hypertension: Secondary | ICD-10-CM | POA: Diagnosis not present

## 2022-08-09 DIAGNOSIS — M170 Bilateral primary osteoarthritis of knee: Secondary | ICD-10-CM | POA: Diagnosis not present

## 2022-08-09 DIAGNOSIS — E782 Mixed hyperlipidemia: Secondary | ICD-10-CM | POA: Diagnosis not present

## 2022-08-09 DIAGNOSIS — K219 Gastro-esophageal reflux disease without esophagitis: Secondary | ICD-10-CM | POA: Diagnosis not present

## 2022-08-09 DIAGNOSIS — Z23 Encounter for immunization: Secondary | ICD-10-CM | POA: Diagnosis not present

## 2022-08-09 DIAGNOSIS — E1165 Type 2 diabetes mellitus with hyperglycemia: Secondary | ICD-10-CM | POA: Diagnosis not present

## 2022-08-09 DIAGNOSIS — E1142 Type 2 diabetes mellitus with diabetic polyneuropathy: Secondary | ICD-10-CM | POA: Diagnosis not present

## 2022-08-11 ENCOUNTER — Ambulatory Visit: Payer: Self-pay | Admitting: Family Medicine

## 2022-09-04 ENCOUNTER — Ambulatory Visit: Payer: Self-pay | Admitting: Family Medicine

## 2022-09-04 ENCOUNTER — Encounter: Payer: Self-pay | Admitting: Family Medicine

## 2022-09-04 ENCOUNTER — Ambulatory Visit (INDEPENDENT_AMBULATORY_CARE_PROVIDER_SITE_OTHER): Payer: 59 | Admitting: Family Medicine

## 2022-09-04 ENCOUNTER — Telehealth: Payer: Self-pay | Admitting: Family Medicine

## 2022-09-04 ENCOUNTER — Other Ambulatory Visit: Payer: 59

## 2022-09-04 VITALS — BP 134/74 | HR 96 | Temp 97.8°F | Ht 67.75 in | Wt 219.6 lb

## 2022-09-04 DIAGNOSIS — M5441 Lumbago with sciatica, right side: Secondary | ICD-10-CM

## 2022-09-04 DIAGNOSIS — Z131 Encounter for screening for diabetes mellitus: Secondary | ICD-10-CM | POA: Diagnosis not present

## 2022-09-04 DIAGNOSIS — I10 Essential (primary) hypertension: Secondary | ICD-10-CM

## 2022-09-04 DIAGNOSIS — G8929 Other chronic pain: Secondary | ICD-10-CM

## 2022-09-04 DIAGNOSIS — M5442 Lumbago with sciatica, left side: Secondary | ICD-10-CM | POA: Diagnosis not present

## 2022-09-04 DIAGNOSIS — Z1159 Encounter for screening for other viral diseases: Secondary | ICD-10-CM | POA: Diagnosis not present

## 2022-09-04 DIAGNOSIS — R600 Localized edema: Secondary | ICD-10-CM

## 2022-09-04 DIAGNOSIS — K219 Gastro-esophageal reflux disease without esophagitis: Secondary | ICD-10-CM | POA: Diagnosis not present

## 2022-09-04 DIAGNOSIS — Z1231 Encounter for screening mammogram for malignant neoplasm of breast: Secondary | ICD-10-CM

## 2022-09-04 DIAGNOSIS — E785 Hyperlipidemia, unspecified: Secondary | ICD-10-CM | POA: Insufficient documentation

## 2022-09-04 DIAGNOSIS — Z1211 Encounter for screening for malignant neoplasm of colon: Secondary | ICD-10-CM

## 2022-09-04 DIAGNOSIS — E782 Mixed hyperlipidemia: Secondary | ICD-10-CM

## 2022-09-04 DIAGNOSIS — R7303 Prediabetes: Secondary | ICD-10-CM | POA: Insufficient documentation

## 2022-09-04 DIAGNOSIS — Z23 Encounter for immunization: Secondary | ICD-10-CM | POA: Diagnosis not present

## 2022-09-04 DIAGNOSIS — M539 Dorsopathy, unspecified: Secondary | ICD-10-CM

## 2022-09-04 LAB — LIPID PANEL
Cholesterol: 269 mg/dL — ABNORMAL HIGH (ref 0–200)
HDL: 40.7 mg/dL (ref >39.00)
LDL Cholesterol: 203 mg/dL — ABNORMAL HIGH (ref 0–99)
NonHDL: 228.64
Total CHOL/HDL Ratio: 7
Triglycerides: 128 mg/dL (ref 0.0–149.0)
VLDL: 25.6 mg/dL (ref 0.0–40.0)

## 2022-09-04 LAB — CBC
HCT: 40.2 % (ref 36.0–46.0)
Hemoglobin: 13.3 g/dL (ref 12.0–15.0)
MCHC: 33.2 g/dL (ref 30.0–36.0)
MCV: 93.5 fl (ref 78.0–100.0)
Platelets: 201 10*3/uL (ref 150.0–400.0)
RBC: 4.3 Mil/uL (ref 3.87–5.11)
RDW: 13.9 % (ref 11.5–15.5)
WBC: 5.8 10*3/uL (ref 4.0–10.5)

## 2022-09-04 LAB — HEMOGLOBIN A1C: Hgb A1c MFr Bld: 6.6 % — ABNORMAL HIGH (ref 4.6–6.5)

## 2022-09-04 LAB — BASIC METABOLIC PANEL
BUN: 17 mg/dL (ref 6–23)
CO2: 25 mEq/L (ref 19–32)
Calcium: 10.5 mg/dL (ref 8.4–10.5)
Chloride: 102 mEq/L (ref 96–112)
Creatinine, Ser: 0.98 mg/dL (ref 0.40–1.20)
GFR: 61.08 mL/min (ref >60.00)
Glucose, Bld: 107 mg/dL — ABNORMAL HIGH (ref 70–99)
Potassium: 4.3 mEq/L (ref 3.5–5.1)
Sodium: 137 mEq/L (ref 135–145)

## 2022-09-04 MED ORDER — OLMESARTAN-AMLODIPINE-HCTZ 40-10-25 MG PO TABS: 1.0000 | ORAL_TABLET | Freq: Every day | ORAL | 3 refills | Status: DC

## 2022-09-04 MED ORDER — PANTOPRAZOLE SODIUM 40 MG PO TBEC: 40.0000 mg | DELAYED_RELEASE_TABLET | Freq: Every day | ORAL | 3 refills | Status: DC

## 2022-09-04 MED ORDER — DICLOFENAC SODIUM 75 MG PO TBEC: 75.0000 mg | DELAYED_RELEASE_TABLET | Freq: Two times a day (BID) | ORAL | 3 refills | Status: DC

## 2022-09-04 MED ORDER — FUROSEMIDE 20 MG PO TABS: 20.0000 mg | ORAL_TABLET | Freq: Every day | ORAL | 5 refills | Status: DC

## 2022-09-04 MED ORDER — ATORVASTATIN CALCIUM 40 MG PO TABS: 40.0000 mg | ORAL_TABLET | Freq: Every day | ORAL | 3 refills | Status: DC

## 2022-09-04 MED ORDER — ATORVASTATIN CALCIUM 20 MG PO TABS: 20.0000 mg | ORAL_TABLET | Freq: Every day | ORAL | 3 refills | Status: DC

## 2022-09-04 NOTE — Progress Notes (Signed)
[facility] [address] [place] [address] Dept: [phone] Dept Fax: [phone]  New Patient Office Visit  Subjective:    Patient ID: Kelly Hurst, female    DOB: 03-Jul-1956, 66 y.o..   MRN: [id number]  Chief Complaint  Patient presents with   Establish Care    NP-Establish care.  C/o having low back pain.     History of Present Illness:  Patient is in today to establish care. Kelly Hurst was born near [place], [place]. She moved to the [place] in 1986. Her husband was a Ship broker in [place], so she moved to be closer. They have been married for 40+ years. She has no children. 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 She denies use of tobacco, alcohol, or drugs.   Kelly Hurst has a history of hypertension. She is managed on olmesartan/amlodipine/HCTZ 40-10-25 mg daily.  Kelly Hurst has a history of hyperlipidemia. She is managed on atorvastatin 20 mg daily.  Kelly Hurst has had issues with lower leg edema. She notes this used ot be much worse than it is now. She is managed on furosemide 20 mg daily.  Kelly Hurst notes issues with low back pain for a number of years. She has pain that runs down into both of her legs. She also notes some numbness throughout the legs. She had am MRI scan at some point in the past. She also was seen by a specialist that gave her injections in the back. During the last injection, the doctor hit something with the needle that caused her to have a sharp pain throughout the entire length of her spine. She finds the pain impairs her ability to walk. She uses a cane. She has been prescribed gabapentin 100 mg, but notes she only takes this on a rare occasion when her pain is worse.  Kelly Hurst has a history of acid reflux. She is manage don pantoprazole, but notes it doesn't seem to provide lasting relief. She only takes this for 2-3 days around times when she has flares of her GERD.   Past Medical  History: Patient Active Problem List   Diagnosis Date Noted   Essential hypertension 09/04/2022   Hyperlipidemia 09/04/2022   Chronic bilateral low back pain with bilateral sciatica 09/04/2022   Pedal edema 09/04/2022   GERD (gastroesophageal reflux disease) 09/04/2022   Past Surgical History:  Procedure Laterality Date   ECTOPIC PREGNANCY SURGERY     Tubal   Family History  Problem Relation Age of Onset   Diabetes Mother    Diabetes Father    Diabetes Sister    Diabetes Brother    Outpatient Medications Prior to Visit  Medication Sig Dispense Refill   aspirin EC 81 MG tablet Take 81 mg by mouth daily. Swallow whole.     atorvastatin (LIPITOR) 20 MG tablet Take 20 mg by mouth daily.     diclofenac (VOLTAREN) 75 MG EC tablet Take 75 mg by mouth 2 (two) times daily.     furosemide (LASIX) 20 MG tablet Take 20 mg by mouth daily.     gabapentin (NEURONTIN) 100 MG capsule Take 100 mg by mouth 3 (three) times daily.     Olmesartan-amLODIPine-HCTZ 40-10-25 MG TABS Take by mouth.     pantoprazole (PROTONIX) 40 MG tablet Take 40 mg by mouth daily.     tiZANidine (ZANAFLEX) 4 MG tablet Take 4 mg by mouth 3 (three) times daily.     No  facility-administered medications prior to visit.   No Known Allergies    Objective:   Today's Vitals   09/04/22 0914  BP: 134/74  Pulse: 96  Temp: 97.8 F (36.6 C)  TempSrc: Temporal  SpO2: 97%  Weight: 219 lb 9.6 oz (99.6 kg)  Height: 5' 7.75" (1.721 m)   Body mass index is 33.64 kg/m.   General: Well developed, well nourished. No acute distress. Extremities: 1+ edema of LE bilat. Psych: Alert and oriented. Normal mood and affect.  Health Maintenance Due  Topic Date Due   HIV Screening  Never done   Hepatitis C Screening  Never done   TETANUS/TDAP  Never done   PAP SMEAR-Modifier  Never done   COLONOSCOPY (Pts 45-84yr Insurance coverage will need to be confirmed)  Never done   MAMMOGRAM  Never done   Zoster Vaccines- Shingrix (1  of 2) Never done     Assessment & Plan:   1. Chronic bilateral low back pain with bilateral sciatica I would like to get an x-ray of Kelly Hurst's lower back. She will return for this. Based on findings, she may need an MRI scan to better elucidate if she is having significant nerve impingement.  - DG Lumbar Spine Complete - diclofenac (VOLTAREN) 75 MG EC tablet; Take 1 tablet (75 mg total) by mouth 2 (two) times daily.  Dispense: 60 tablet; Refill: 3  2. Mixed hyperlipidemia I will check fasting lipids today.Continue atorvastatin.  - Lipid panel - atorvastatin (LIPITOR) 20 MG tablet; Take 1 tablet (20 mg total) by mouth daily.  Dispense: 90 tablet; Refill: 3  3. Essential hypertension Blood pressure is adequately controled. Continue Tribenzor daily.  - Basic metabolic panel - Olmesartan-amLODIPine-HCTZ 40-10-25 MG TABS; Take 1 tablet by mouth daily at 12 noon.  Dispense: 90 tablet; Refill: 3  4. Pedal edema Stable. Cotninue Lasix.  - furosemide (LASIX) 20 MG tablet; Take 1 tablet (20 mg total) by mouth daily.  Dispense: 30 tablet; Refill: 5  5. Gastroesophageal reflux disease without esophagitis I recommend Kelly Hurst use her Protonix daily for 30 days, then give a trial off the medicine. - CBC - pantoprazole (PROTONIX) 40 MG tablet; Take 1 tablet (40 mg total) by mouth daily.  Dispense: 30 tablet; Refill: 3  6. Screening for diabetes mellitus (DM)  - Hemoglobin A1c  7. Encounter for hepatitis C screening test for low risk patient  - HCV Ab w Reflex to Quant PCR  8. Encounter for screening mammogram for malignant neoplasm of breast  - MM DIGITAL SCREENING BILATERAL; Future  9. Screening for colon cancer  - Ambulatory referral to Gastroenterology  10. Need for Tdap vaccination  - Tdap vaccine greater than or equal to 7yo IM   Return in about 4 weeks (around 10/02/2022) for Reassessment.   SHaydee Salter MD

## 2022-09-04 NOTE — Telephone Encounter (Signed)
Caller Name: Jeny Nield Call back phone #: [phone]   MEDICATION(S):  All current meds, please call in to pharmacy    Preferred Pharmacy:  [facility], [address]  hone: [phone]  Fax: [phone]    ~~~Please advise patient/caregiver to allow 2-3 business days to process RX refills.

## 2022-09-04 NOTE — Addendum Note (Signed)
Addended by: Haydee Salter on: 09/04/2022 06:47 PM   Modules accepted: Orders

## 2022-09-04 NOTE — Telephone Encounter (Signed)
Refills will be sent from [facility] note today.  Dm/cma

## 2022-09-05 ENCOUNTER — Other Ambulatory Visit: Payer: 59

## 2022-09-05 ENCOUNTER — Ambulatory Visit (INDEPENDENT_AMBULATORY_CARE_PROVIDER_SITE_OTHER): Payer: 59

## 2022-09-05 DIAGNOSIS — G8929 Other chronic pain: Secondary | ICD-10-CM | POA: Diagnosis not present

## 2022-09-05 DIAGNOSIS — M5442 Lumbago with sciatica, left side: Secondary | ICD-10-CM

## 2022-09-05 DIAGNOSIS — M5441 Lumbago with sciatica, right side: Secondary | ICD-10-CM | POA: Diagnosis not present

## 2022-09-05 DIAGNOSIS — M545 Low back pain, unspecified: Secondary | ICD-10-CM | POA: Diagnosis not present

## 2022-09-05 LAB — HCV AB W REFLEX TO QUANT PCR: HCV Ab: NONREACTIVE

## 2022-09-05 LAB — HCV INTERPRETATION

## 2022-09-05 NOTE — Progress Notes (Unsigned)
Initial visit for chronic lower back pain with sciatica and leg weakness. Denies injury

## 2022-09-05 NOTE — Telephone Encounter (Signed)
Caller Name: Marcell Pfeifer Call back phone #: [phone]  Reason for Call: Asked for meds to be sent over to [facility] [address] [place], [place] [address]

## 2022-09-06 NOTE — Telephone Encounter (Signed)
Lft VM to rtn call. Dm/cma  

## 2022-09-08 DIAGNOSIS — M539 Dorsopathy, unspecified: Secondary | ICD-10-CM | POA: Insufficient documentation

## 2022-09-08 NOTE — Addendum Note (Signed)
Addended by: Haydee Salter on: 09/08/2022 12:49 PM   Modules accepted: Orders

## 2022-09-08 NOTE — Telephone Encounter (Signed)
Lft VM to rtn call. Dm/cma  

## 2022-09-11 NOTE — Telephone Encounter (Signed)
Pt called back, please return call  

## 2022-09-12 NOTE — Telephone Encounter (Signed)
Lft VM to rtn call. Dm/cma  

## 2022-09-27 NOTE — Addendum Note (Signed)
Addended by: Konrad Saha on: 09/27/2022 04:32 PM   Modules accepted: Orders

## 2022-09-27 NOTE — Addendum Note (Signed)
Addended by: Konrad Saha on: 09/27/2022 04:34 PM   Modules accepted: Orders

## 2022-09-29 ENCOUNTER — Other Ambulatory Visit: Payer: Self-pay

## 2022-09-29 ENCOUNTER — Emergency Department (HOSPITAL_COMMUNITY): Payer: 59

## 2022-09-29 ENCOUNTER — Encounter (HOSPITAL_COMMUNITY): Payer: Self-pay

## 2022-09-29 ENCOUNTER — Emergency Department (HOSPITAL_COMMUNITY)
Admission: EM | Admit: 2022-09-29 | Discharge: 2022-09-29 | Disposition: A | Discharge status: Home / Self Care | Payer: 59 | Attending: Emergency Medicine | Admitting: Emergency Medicine

## 2022-09-29 ENCOUNTER — Ambulatory Visit (HOSPITAL_COMMUNITY): Payer: 59

## 2022-09-29 DIAGNOSIS — E782 Mixed hyperlipidemia: Secondary | ICD-10-CM | POA: Diagnosis not present

## 2022-09-29 DIAGNOSIS — R112 Nausea with vomiting, unspecified: Secondary | ICD-10-CM | POA: Diagnosis not present

## 2022-09-29 DIAGNOSIS — E119 Type 2 diabetes mellitus without complications: Secondary | ICD-10-CM | POA: Diagnosis not present

## 2022-09-29 DIAGNOSIS — R1010 Upper abdominal pain, unspecified: Secondary | ICD-10-CM | POA: Diagnosis not present

## 2022-09-29 DIAGNOSIS — K2900 Acute gastritis without bleeding: Secondary | ICD-10-CM | POA: Insufficient documentation

## 2022-09-29 DIAGNOSIS — R1084 Generalized abdominal pain: Secondary | ICD-10-CM | POA: Diagnosis not present

## 2022-09-29 DIAGNOSIS — I700 Atherosclerosis of aorta: Secondary | ICD-10-CM | POA: Diagnosis not present

## 2022-09-29 DIAGNOSIS — E1142 Type 2 diabetes mellitus with diabetic polyneuropathy: Secondary | ICD-10-CM | POA: Diagnosis not present

## 2022-09-29 DIAGNOSIS — Z7982 Long term (current) use of aspirin: Secondary | ICD-10-CM | POA: Diagnosis not present

## 2022-09-29 DIAGNOSIS — Z79899 Other long term (current) drug therapy: Secondary | ICD-10-CM | POA: Diagnosis not present

## 2022-09-29 DIAGNOSIS — I10 Essential (primary) hypertension: Secondary | ICD-10-CM | POA: Diagnosis not present

## 2022-09-29 DIAGNOSIS — R109 Unspecified abdominal pain: Secondary | ICD-10-CM | POA: Diagnosis not present

## 2022-09-29 DIAGNOSIS — M170 Bilateral primary osteoarthritis of knee: Secondary | ICD-10-CM | POA: Diagnosis not present

## 2022-09-29 DIAGNOSIS — K219 Gastro-esophageal reflux disease without esophagitis: Secondary | ICD-10-CM | POA: Diagnosis not present

## 2022-09-29 DIAGNOSIS — E1165 Type 2 diabetes mellitus with hyperglycemia: Secondary | ICD-10-CM | POA: Diagnosis not present

## 2022-09-29 LAB — URINALYSIS, ROUTINE W REFLEX MICROSCOPIC
Bacteria, UA: NONE SEEN
Bilirubin Urine: NEGATIVE
Glucose, UA: NEGATIVE mg/dL
Hgb urine dipstick: NEGATIVE
Ketones, ur: NEGATIVE mg/dL
Leukocytes,Ua: NEGATIVE
Nitrite: NEGATIVE
Specific Gravity, Urine: >1.03 — ABNORMAL HIGH (ref 1.005–1.030)
pH: 5.5 (ref 5.0–8.0)

## 2022-09-29 LAB — COMPREHENSIVE METABOLIC PANEL
ALT: 17 U/L (ref 0–44)
AST: 19 U/L (ref 15–41)
Albumin: 4.3 g/dL (ref 3.5–5.0)
Alkaline Phosphatase: 70 U/L (ref 38–126)
Anion gap: 10 (ref 5–15)
BUN: 15 mg/dL (ref 8–23)
CO2: 23 mmol/L (ref 22–32)
Calcium: 10.4 mg/dL — ABNORMAL HIGH (ref 8.9–10.3)
Chloride: 105 mmol/L (ref 98–111)
Creatinine, Ser: 0.76 mg/dL (ref 0.44–1.00)
GFR, Estimated: >60 mL/min (ref >60)
Glucose, Bld: 113 mg/dL — ABNORMAL HIGH (ref 70–99)
Potassium: 3.5 mmol/L (ref 3.5–5.1)
Sodium: 138 mmol/L (ref 135–145)
Total Bilirubin: 0.9 mg/dL (ref 0.3–1.2)
Total Protein: 8.2 g/dL — ABNORMAL HIGH (ref 6.5–8.1)

## 2022-09-29 LAB — CBC WITH DIFFERENTIAL/PLATELET
Abs Immature Granulocytes: 0.02 10*3/uL (ref 0.00–0.07)
Basophils Absolute: 0 10*3/uL (ref 0.0–0.1)
Basophils Relative: 0 %
Eosinophils Absolute: 0 10*3/uL (ref 0.0–0.5)
Eosinophils Relative: 0 %
HCT: 43.1 % (ref 36.0–46.0)
Hemoglobin: 13.9 g/dL (ref 12.0–15.0)
Immature Granulocytes: 0 %
Lymphocytes Relative: 36 %
Lymphs Abs: 2.6 10*3/uL (ref 0.7–4.0)
MCH: 30.6 pg (ref 26.0–34.0)
MCHC: 32.3 g/dL (ref 30.0–36.0)
MCV: 94.9 fL (ref 80.0–100.0)
Monocytes Absolute: 0.7 10*3/uL (ref 0.1–1.0)
Monocytes Relative: 10 %
Neutro Abs: 3.7 10*3/uL (ref 1.7–7.7)
Neutrophils Relative %: 54 %
Platelets: 260 10*3/uL (ref 150–400)
RBC: 4.54 MIL/uL (ref 3.87–5.11)
RDW: 13.4 % (ref 11.5–15.5)
WBC: 7 10*3/uL (ref 4.0–10.5)
nRBC: 0 % (ref 0.0–0.2)

## 2022-09-29 LAB — LIPASE, BLOOD: Lipase: 33 U/L (ref 11–51)

## 2022-09-29 MED ORDER — SODIUM CHLORIDE 0.9 % IV BOLUS
500.0000 mL | Freq: Once | INTRAVENOUS | Status: AC
  Administered 2022-09-29: 500 mL via INTRAVENOUS

## 2022-09-29 MED ORDER — IOHEXOL 300 MG/ML  SOLN
100.0000 mL | Freq: Once | INTRAMUSCULAR | Status: AC | PRN
  Administered 2022-09-29: 100 mL via INTRAVENOUS

## 2022-09-29 MED ORDER — SODIUM CHLORIDE 0.9 % IV SOLN: INTRAVENOUS | Status: DC

## 2022-09-29 NOTE — ED Provider Triage Note (Signed)
Emergency Medicine Provider Triage Evaluation Note  Kelly Hurst , a 66 y.o. female  was evaluated in triage.  Patient presenting with abdominal pain since last night.  Says that she saw her PCP this morning and her PCP did an x-ray and told her to come here.   Per review of patient's records today she was told that she had uterine fibroids on a KUB...  No pelvic or vaginal symptoms  Review of Systems  Positive:  Negative:   Physical Exam  BP (!) 145/83 (BP Location: Right Arm)   Pulse (!) 104   Temp 98.7 F (37.1 C) (Oral)   Resp 18   Ht '5\' 8"'$  (1.727 m)   Wt 97.5 kg   SpO2 95%   BMI 32.69 kg/m  Gen:   Awake, no distress   Resp:  Normal effort  MSK:   Moves extremities without difficulty  Other:  Generally tender abdomen in right upper, epigastrium and left upper quadrants  Medical Decision Making  Medically screening exam initiated at 3:32 PM.  Appropriate orders placed.  Kelly Hurst was informed that the remainder of the evaluation will be completed by another provider, this initial triage assessment does not replace that evaluation, and the importance of remaining in the ED until their evaluation is complete.   Will do lab work and order CT scan as apparently patient's PCP said they were more concerned about gallbladder   Rhae Hammock, PA-C 09/29/22 1533

## 2022-09-29 NOTE — ED Triage Notes (Addendum)
Pt reports abdominal pain since last night with vomiting. Pt had xray this morning at pcp "uterine fibroids" Pt denies vaginal bleeding or urinary symptoms.

## 2022-09-29 NOTE — ED Provider Notes (Addendum)
[facility] DEPT Provider Note   CSN: [id number] Arrival date & time: 09/29/22  1219     History  Chief Complaint  Patient presents with   Abdominal Pain    Kelly Hurst is a 66 y.o. female.  Patient with onset at 38 PM last night generalized abdominal pain with vomiting did not vomit any blood.  Patient seen by primary care doctor they referred him for CT of the abdomen.  Patient has a history of uterine fibroids denies any vaginal bleeding or urinary symptoms.  No fevers.  No history of similar pain.  Past medical history significant for diabetes hypertension patient had an ectopic pregnancy surgery in the past.  Patient does not use tobacco products.       Home Medications Prior to Admission medications   Medication Sig Start Date End Date Taking? Authorizing Provider  aspirin EC 81 MG tablet Take 81 mg by mouth daily. Swallow whole.    [provider]  atorvastatin (LIPITOR) 40 MG tablet Take 1 tablet (40 mg total) by mouth daily. 09/04/22   Haydee Salter, MD  diclofenac (VOLTAREN) 75 MG EC tablet Take 1 tablet (75 mg total) by mouth 2 (two) times daily. 09/04/22   Haydee Salter, MD  furosemide (LASIX) 20 MG tablet Take 1 tablet (20 mg total) by mouth daily. 09/04/22   Haydee Salter, MD  gabapentin (NEURONTIN) 100 MG capsule Take 100 mg by mouth 3 (three) times daily.    [provider]  Olmesartan-amLODIPine-HCTZ 40-10-25 MG TABS Take 1 tablet by mouth daily at 12 noon. 09/04/22   Haydee Salter, MD  pantoprazole (PROTONIX) 40 MG tablet Take 1 tablet (40 mg total) by mouth daily. 09/04/22   Haydee Salter, MD  tiZANidine (ZANAFLEX) 4 MG tablet Take 4 mg by mouth 3 (three) times daily.    [provider]  Turmeric (QC TUMERIC COMPLEX PO) Take by mouth.    [provider]      Allergies    Patient has no known allergies.    Review of Systems   Review of Systems  Constitutional:  Negative for chills  and fever.  HENT:  Negative for ear pain and sore throat.   Eyes:  Negative for pain and visual disturbance.  Respiratory:  Negative for cough and shortness of breath.   Cardiovascular:  Negative for chest pain and palpitations.  Gastrointestinal:  Positive for abdominal pain, nausea and vomiting. Negative for blood in stool and diarrhea.  Genitourinary:  Negative for dysuria, hematuria and vaginal bleeding.  Musculoskeletal:  Negative for arthralgias and back pain.  Skin:  Negative for color change and rash.  Neurological:  Negative for seizures and syncope.  All other systems reviewed and are negative.   Physical Exam Updated Vital Signs BP 121/82 (BP Location: Right Arm)   Pulse 99   Temp 98.8 F (37.1 C) (Oral)   Resp 16   Ht 1.727 m ('5\' 8"'$ )   Wt 97.5 kg   SpO2 97%   BMI 32.69 kg/m  Physical Exam Vitals and nursing note reviewed.  Constitutional:      General: She is not in acute distress.    Appearance: She is well-developed. She is not ill-appearing.  HENT:     Head: Normocephalic and atraumatic.  Eyes:     Extraocular Movements: Extraocular movements intact.     Conjunctiva/sclera: Conjunctivae normal.     Pupils: Pupils are equal, round, and reactive to light.  Cardiovascular:     Rate and Rhythm: Normal rate and regular rhythm.     Heart sounds: No murmur heard. Pulmonary:     Effort: Pulmonary effort is normal. No respiratory distress.     Breath sounds: Normal breath sounds.  Abdominal:     General: Bowel sounds are normal. There is no distension.     Palpations: Abdomen is soft.     Tenderness: There is no abdominal tenderness.     Hernia: No hernia is present.  Musculoskeletal:        General: No swelling.     Cervical back: Neck supple.  Skin:    General: Skin is warm and dry.     Capillary Refill: Capillary refill takes less than 2 seconds.  Neurological:     General: No focal deficit present.     Mental Status: She is alert and oriented to  person, place, and time.  Psychiatric:        Mood and Affect: Mood normal.     ED Results / Procedures / Treatments   Labs (all labs ordered are listed, but only abnormal results are displayed) Labs Reviewed  COMPREHENSIVE METABOLIC PANEL - Abnormal; Notable for the following components:      Result Value   Glucose, Bld 113 (*)    Calcium 10.4 (*)    Total Protein 8.2 (*)    All other components within normal limits  URINALYSIS, ROUTINE W REFLEX MICROSCOPIC - Abnormal; Notable for the following components:   Specific Gravity, Urine >1.030 (*)    Protein, ur TRACE (*)    All other components within normal limits  CBC WITH DIFFERENTIAL/PLATELET  LIPASE, BLOOD    EKG None  Radiology No results found.  Procedures Procedures    Medications Ordered in ED Medications  iohexol (OMNIPAQUE) 300 MG/ML solution 100 mL (has no administration in time range)  0.9 %  sodium chloride infusion (has no administration in time range)  sodium chloride 0.9 % bolus 500 mL (has no administration in time range)    ED Course/ Medical Decision Making/ A&P                           Medical Decision Making Risk Prescription drug management.   Patient is a abdomen soft and nontender but subjectively has abdominal pain.  Patient did not want any antinausea medicine or any pain medicine at this time.  CT scan abdomen pelvis is pending.  Urinalysis had 21-50 whites.  No bacteria urine very concentrated nitrite negative.  CBC no leukocytosis hemoglobin 13.9.  Complete metabolic panel blood sugar 113 potassium normal GFR normal greater than 60.  Liver function test normal.  Lipase normal.  May very well be a gastritis.  Disposition will be based on CT scan abdomen pelvis.  Patient currently nontoxic no acute distress.  CT abdomen without any acute findings.  Will hydrate.  Reassess.  Patient feeling much better after the IV fluids.'s.  Patient without any further nausea no significant abdominal  pain.  Stable for discharge.  Does not want any medications.  Will return for any new or worse symptoms   Final Clinical Impression(s) / ED Diagnoses Final diagnoses:  Generalized abdominal pain  Acute gastritis without hemorrhage, unspecified gastritis type    Rx / DC Orders ED Discharge Orders     None         Fredia Sorrow, MD 09/29/22 2038    Fredia Sorrow, MD 09/29/22  2105    Fredia Sorrow, MD 09/29/22 2302

## 2022-09-29 NOTE — Discharge Instructions (Signed)
Recommend clear liquids and then advance to bland diet as tolerated.  Work-up here today without any acute findings including CT scan of the abdomen pelvis.  Suspect a viral gastritis.  Return for any new or worse symptoms.  Make an appointment to follow-up with your doctor.

## 2022-10-03 ENCOUNTER — Encounter: Payer: Self-pay | Admitting: Family Medicine

## 2022-10-03 ENCOUNTER — Ambulatory Visit (INDEPENDENT_AMBULATORY_CARE_PROVIDER_SITE_OTHER): Payer: 59 | Admitting: Family Medicine

## 2022-10-03 ENCOUNTER — Other Ambulatory Visit (HOSPITAL_COMMUNITY)
Admission: RE | Admit: 2022-10-03 | Discharge: 2022-10-03 | Disposition: A | Discharge status: Home / Self Care | Payer: 59 | Source: Ambulatory Visit | Attending: Family Medicine | Admitting: Family Medicine

## 2022-10-03 VITALS — BP 144/80 | HR 91 | Temp 98.8°F | Ht 68.0 in | Wt 216.6 lb

## 2022-10-03 DIAGNOSIS — M5441 Lumbago with sciatica, right side: Secondary | ICD-10-CM

## 2022-10-03 DIAGNOSIS — Z124 Encounter for screening for malignant neoplasm of cervix: Secondary | ICD-10-CM | POA: Diagnosis not present

## 2022-10-03 DIAGNOSIS — K5790 Diverticulosis of intestine, part unspecified, without perforation or abscess without bleeding: Secondary | ICD-10-CM | POA: Insufficient documentation

## 2022-10-03 DIAGNOSIS — M5442 Lumbago with sciatica, left side: Secondary | ICD-10-CM | POA: Diagnosis not present

## 2022-10-03 DIAGNOSIS — I700 Atherosclerosis of aorta: Secondary | ICD-10-CM | POA: Insufficient documentation

## 2022-10-03 DIAGNOSIS — M539 Dorsopathy, unspecified: Secondary | ICD-10-CM

## 2022-10-03 DIAGNOSIS — G8929 Other chronic pain: Secondary | ICD-10-CM

## 2022-10-03 DIAGNOSIS — I10 Essential (primary) hypertension: Secondary | ICD-10-CM | POA: Diagnosis not present

## 2022-10-03 DIAGNOSIS — D259 Leiomyoma of uterus, unspecified: Secondary | ICD-10-CM | POA: Insufficient documentation

## 2022-10-03 MED ORDER — OLMESARTAN-AMLODIPINE-HCTZ 40-10-25 MG PO TABS: 1.0000 | ORAL_TABLET | Freq: Every day | ORAL | 3 refills | Status: DC

## 2022-10-03 NOTE — Progress Notes (Signed)
[facility] [address] [place] [address] Dept: [phone] Dept Fax: [phone]  Chronic Care Office Visit  Subjective:    Patient ID: Kelly Hurst, female    DOB: 18-Jul-1956, 66 y.o..   MRN: [id number]  Chief Complaint  Patient presents with   Follow-up    4 week f/u, pap. Went to ER on 12/1/3 for abdominal pain. Feeling better.     History of Present Illness:  Patient is in today for reassessment of chronic medical issues.  Ms. Cowles was seen on 09/29/2022 at [facility] with generalized abdominal pain. She had lab work and an abdominal CT scan. She notes her abdomen is doing better at this point.  Ms. Holness has a history of hypertension. She is managed on olmesartan/amlodipine/HCTZ 40-10-25 mg daily.   At her initial visit, Ms. Aloi noted issues with low back pain for a number of years. She has pain that runs down into both of her legs. She also notes some numbness throughout the legs. She had am MRI scan at some point in the past. She also was seen by a specialist that gave her injections in the back. During the last injection, the doctor hit something with the needle that caused her to have a sharp pain throughout the entire length of her spine. She finds the pain impairs her ability to walk. She uses a cane. She has been prescribed gabapentin 100 mg, but is not taking this out of concern for possible side effects.   Past Medical History: Patient Active Problem List   Diagnosis Date Noted   Aortic atherosclerosis ([place]) 10/03/2022   Multilevel degenerative disc disease 09/08/2022   Essential hypertension 09/04/2022   Hyperlipidemia 09/04/2022   Chronic bilateral low back pain with bilateral sciatica 09/04/2022   Pedal edema 09/04/2022   GERD (gastroesophageal reflux disease) 09/04/2022   Prediabetes 09/04/2022   Past Surgical History:  Procedure Laterality Date   ECTOPIC PREGNANCY SURGERY     Tubal   Family  History  Problem Relation Age of Onset   Diabetes Mother    Diabetes Father    Diabetes Sister    Diabetes Brother    Outpatient Medications Prior to Visit  Medication Sig Dispense Refill   aspirin EC 81 MG tablet Take 81 mg by mouth daily. Swallow whole.     atorvastatin (LIPITOR) 40 MG tablet Take 1 tablet (40 mg total) by mouth daily. 90 tablet 3   diclofenac (VOLTAREN) 75 MG EC tablet Take 1 tablet (75 mg total) by mouth 2 (two) times daily. 60 tablet 3   furosemide (LASIX) 20 MG tablet Take 1 tablet (20 mg total) by mouth daily. 30 tablet 5   pantoprazole (PROTONIX) 40 MG tablet Take 1 tablet (40 mg total) by mouth daily. 30 tablet 3   tiZANidine (ZANAFLEX) 4 MG tablet Take 4 mg by mouth 3 (three) times daily.     Turmeric (QC TUMERIC COMPLEX PO) Take by mouth.     Olmesartan-amLODIPine-HCTZ 40-10-25 MG TABS Take 1 tablet by mouth daily at 12 noon. 90 tablet 3   gabapentin (NEURONTIN) 100 MG capsule Take 100 mg by mouth 3 (three) times daily. (Patient not taking: Reported on 10/03/2022)     No facility-administered medications prior to visit.   No Known Allergies    Objective:   Today's Vitals   10/03/22 1008  BP: (!) 144/80  Pulse: 91  Temp: 98.8 F (37.1 C)  TempSrc: Temporal  SpO2: 98%  Weight: 216 lb 9.6 oz (98.2 kg)  Height: '5\' 8"'$  (1.727 m)   Body mass index is 32.93 kg/m.   General: Well developed, well nourished. No acute distress. GU: Normal external genitalia. Vaginal mucosa is pale with some hyperemic areas. Cervix is   pink and appears normal. No CMT. Uterus normal size. No adnexal masses. No anterior or   posterior wall prolapse. Psych: Alert and oriented. Normal mood and affect.  Health Maintenance Due  Topic Date Due   HIV Screening  Never done   PAP SMEAR-Modifier  Never done   COLONOSCOPY (Pts 45-59yr Insurance coverage will need to be confirmed)  Never done   MAMMOGRAM  Never done   Zoster Vaccines- Shingrix (1 of 2) Never done   Lab  Results Last CBC Lab Results  Component Value Date   WBC 7.0 09/29/2022   HGB 13.9 09/29/2022   HCT 43.1 09/29/2022   MCV 94.9 09/29/2022   MCH 30.6 09/29/2022   RDW 13.4 09/29/2022   PLT 260 140/98/1191  Last metabolic panel Lab Results  Component Value Date   GLUCOSE 113 (H) 09/29/2022   NA 138 09/29/2022   K 3.5 09/29/2022   CL 105 09/29/2022   CO2 23 09/29/2022   BUN 15 09/29/2022   CREATININE 0.76 09/29/2022   GFRNONAA >60 09/29/2022   CALCIUM 10.4 (H) 09/29/2022   PROT 8.2 (H) 09/29/2022   ALBUMIN 4.3 09/29/2022   BILITOT 0.9 09/29/2022   ALKPHOS 70 09/29/2022   AST 19 09/29/2022   ALT 17 09/29/2022   ANIONGAP 10 09/29/2022   Component Ref Range & Units 4 d ago 7 yr ago  Lipase 11 - 51 U/L 33 22 R   Component Ref Range & Units 4 d ago 7 yr ago  Color, Urine YELLOW YELLOW YELLOW  APPearance CLEAR CLEAR CLEAR  Specific Gravity, Urine 1.005 - 1.030 >1.030 High  1.018  pH 5.0 - 8.0 5.5 7.0  Glucose, UA NEGATIVE mg/dL NEGATIVE NEGATIVE  Hgb urine dipstick NEGATIVE NEGATIVE NEGATIVE  Bilirubin Urine NEGATIVE NEGATIVE NEGATIVE  Ketones, ur NEGATIVE mg/dL NEGATIVE NEGATIVE  Protein, ur NEGATIVE mg/dL TRACE Abnormal  NEGATIVE  Nitrite NEGATIVE NEGATIVE NEGATIVE  Leukocytes,Ua NEGATIVE NEGATIVE   RBC / HPF 0 - 5 RBC/hpf 0-5   WBC, UA 0 - 5 WBC/hpf 21-50   Bacteria, UA NONE SEEN NONE SEEN   Squamous Epithelial / LPF 0 - 5 0-5   Mucus  PRESENT    Imaging: CT of Abdomen and Pelvis wo contrast (09/29/2022) IMPRESSION: 1. No acute intra-abdominal or pelvic pathology. 2. Colonic diverticulosis. No bowel obstruction. Normal appendix. 3. Myomatous uterus. 4.  Aortic Atherosclerosis (ICD10-I70.0).    Assessment & Plan:   1. Chronic bilateral low back pain with bilateral sciatica Back pain persists despite conservative approaches. I had placed a referral for an MRI scan of the lumbar spine, but apparently there have been insurance issues that has prevented getting  this completed. I will refer Ms. Kham to neurosurgery for evaluation. It may be that they can facilitate getting an MRI performed.  - Ambulatory referral to Neurosurgery  2. Multilevel degenerative disc disease As above.  - Ambulatory referral to Neurosurgery  3. Essential hypertension Blood pressure is moderately high today. This may be being exacerbated by her pain. I will continue her current combination pill. If not improved at her next visit, would consider addition of spironolactone.  - Olmesartan-amLODIPine-HCTZ 40-10-25 MG TABS; Take 1 tablet by mouth daily at 12 noon.  Dispense: 90 tablet; Refill: 3  4. Screening for cervical cancer  - Cytology - PAP  Return in about 3 months (around 01/02/2023) for Reassessment.   Haydee Salter, MD

## 2022-10-10 ENCOUNTER — Encounter: Payer: Self-pay | Admitting: Family Medicine

## 2022-10-10 DIAGNOSIS — R87610 Atypical squamous cells of undetermined significance on cytologic smear of cervix (ASC-US): Secondary | ICD-10-CM | POA: Insufficient documentation

## 2022-10-10 LAB — CYTOLOGY - PAP
Comment: NEGATIVE
Diagnosis: UNDETERMINED — AB
High risk HPV: NEGATIVE

## 2022-10-12 DIAGNOSIS — Z6832 Body mass index (BMI) 32.0-32.9, adult: Secondary | ICD-10-CM | POA: Diagnosis not present

## 2022-10-12 DIAGNOSIS — M4310 Spondylolisthesis, site unspecified: Secondary | ICD-10-CM | POA: Diagnosis not present

## 2022-10-12 DIAGNOSIS — M48062 Spinal stenosis, lumbar region with neurogenic claudication: Secondary | ICD-10-CM | POA: Diagnosis not present

## 2022-10-25 ENCOUNTER — Other Ambulatory Visit: Payer: Self-pay | Admitting: Neurosurgery

## 2022-10-25 DIAGNOSIS — M4310 Spondylolisthesis, site unspecified: Secondary | ICD-10-CM

## 2022-11-08 ENCOUNTER — Ambulatory Visit
Admission: RE | Admit: 2022-11-08 | Discharge: 2022-11-08 | Disposition: A | Discharge status: Home / Self Care | Payer: 59 | Source: Ambulatory Visit | Attending: Neurosurgery | Admitting: Neurosurgery

## 2022-11-08 DIAGNOSIS — M4310 Spondylolisthesis, site unspecified: Secondary | ICD-10-CM

## 2022-11-08 DIAGNOSIS — M4316 Spondylolisthesis, lumbar region: Secondary | ICD-10-CM | POA: Diagnosis not present

## 2022-11-08 DIAGNOSIS — M545 Low back pain, unspecified: Secondary | ICD-10-CM | POA: Diagnosis not present

## 2022-11-08 DIAGNOSIS — M48061 Spinal stenosis, lumbar region without neurogenic claudication: Secondary | ICD-10-CM | POA: Diagnosis not present

## 2022-11-16 DIAGNOSIS — M4310 Spondylolisthesis, site unspecified: Secondary | ICD-10-CM | POA: Diagnosis not present

## 2022-11-16 DIAGNOSIS — G992 Myelopathy in diseases classified elsewhere: Secondary | ICD-10-CM | POA: Diagnosis not present

## 2022-11-16 DIAGNOSIS — M48062 Spinal stenosis, lumbar region with neurogenic claudication: Secondary | ICD-10-CM | POA: Diagnosis not present

## 2022-11-16 DIAGNOSIS — M4804 Spinal stenosis, thoracic region: Secondary | ICD-10-CM | POA: Diagnosis not present

## 2022-11-16 DIAGNOSIS — Z6832 Body mass index (BMI) 32.0-32.9, adult: Secondary | ICD-10-CM | POA: Diagnosis not present

## 2022-11-17 ENCOUNTER — Other Ambulatory Visit: Payer: Self-pay | Admitting: Neurosurgery

## 2022-11-17 DIAGNOSIS — G992 Myelopathy in diseases classified elsewhere: Secondary | ICD-10-CM

## 2022-11-28 ENCOUNTER — Ambulatory Visit
Admission: RE | Admit: 2022-11-28 | Discharge: 2022-11-28 | Disposition: A | Discharge status: Home / Self Care | Payer: 59 | Source: Ambulatory Visit | Attending: Family Medicine | Admitting: Family Medicine

## 2022-11-28 DIAGNOSIS — Z1231 Encounter for screening mammogram for malignant neoplasm of breast: Secondary | ICD-10-CM | POA: Diagnosis not present

## 2022-12-09 ENCOUNTER — Ambulatory Visit
Admission: RE | Admit: 2022-12-09 | Discharge: 2022-12-09 | Disposition: A | Discharge status: Home / Self Care | Payer: 59 | Source: Ambulatory Visit | Attending: Neurosurgery | Admitting: Neurosurgery

## 2022-12-09 DIAGNOSIS — G992 Myelopathy in diseases classified elsewhere: Secondary | ICD-10-CM

## 2022-12-09 DIAGNOSIS — M549 Dorsalgia, unspecified: Secondary | ICD-10-CM | POA: Diagnosis not present

## 2023-01-01 ENCOUNTER — Telehealth: Payer: Self-pay

## 2023-01-01 NOTE — Progress Notes (Signed)
   01/01/2023  Patient ID: Kelly Hurst, female   DOB: 02/12/1956, 67 y.o.   MRN: [id number]  Patient appearing on report for [employer] - Hypertension Control report due to last documented ambulatory blood pressure of 144/80 on 10/03/22. Next appointment with PCP is 01/03/23   Outreached patient to discuss hypertension control and medication management. Left voicemail for patient to return my call at their convenience.   Darlina Guys, PharmD, DPLA

## 2023-01-03 ENCOUNTER — Encounter: Payer: Self-pay | Admitting: Family Medicine

## 2023-01-03 ENCOUNTER — Ambulatory Visit (INDEPENDENT_AMBULATORY_CARE_PROVIDER_SITE_OTHER): Payer: 59 | Admitting: Family Medicine

## 2023-01-03 VITALS — BP 172/94 | HR 83 | Temp 98.0°F | Ht 68.0 in | Wt 217.0 lb

## 2023-01-03 DIAGNOSIS — E782 Mixed hyperlipidemia: Secondary | ICD-10-CM

## 2023-01-03 DIAGNOSIS — Z1211 Encounter for screening for malignant neoplasm of colon: Secondary | ICD-10-CM | POA: Diagnosis not present

## 2023-01-03 DIAGNOSIS — M4805 Spinal stenosis, thoracolumbar region: Secondary | ICD-10-CM | POA: Diagnosis not present

## 2023-01-03 DIAGNOSIS — R600 Localized edema: Secondary | ICD-10-CM | POA: Diagnosis not present

## 2023-01-03 DIAGNOSIS — I10 Essential (primary) hypertension: Secondary | ICD-10-CM | POA: Diagnosis not present

## 2023-01-03 DIAGNOSIS — I700 Atherosclerosis of aorta: Secondary | ICD-10-CM

## 2023-01-03 DIAGNOSIS — E041 Nontoxic single thyroid nodule: Secondary | ICD-10-CM | POA: Diagnosis not present

## 2023-01-03 DIAGNOSIS — M47812 Spondylosis without myelopathy or radiculopathy, cervical region: Secondary | ICD-10-CM | POA: Insufficient documentation

## 2023-01-03 DIAGNOSIS — M4802 Spinal stenosis, cervical region: Secondary | ICD-10-CM | POA: Insufficient documentation

## 2023-01-03 MED ORDER — OLMESARTAN-AMLODIPINE-HCTZ 40-10-25 MG PO TABS: 1.0000 | ORAL_TABLET | Freq: Every day | ORAL | 3 refills | Status: DC

## 2023-01-03 MED ORDER — ASPIRIN 81 MG PO TBEC: 81.0000 mg | DELAYED_RELEASE_TABLET | Freq: Every day | ORAL | 3 refills | Status: DC

## 2023-01-03 MED ORDER — FUROSEMIDE 20 MG PO TABS: 20.0000 mg | ORAL_TABLET | Freq: Every day | ORAL | 5 refills | Status: DC

## 2023-01-03 MED ORDER — ATORVASTATIN CALCIUM 40 MG PO TABS: 40.0000 mg | ORAL_TABLET | Freq: Every day | ORAL | 3 refills | Status: DC

## 2023-01-03 NOTE — Assessment & Plan Note (Signed)
I will renew her atorvastatin.

## 2023-01-03 NOTE — Assessment & Plan Note (Signed)
Blood pressure is quite high today. However, Kelly Hurst has been off of her medication. I will renew her meds and plan to see her back in 1 month to reassess her BP.

## 2023-01-03 NOTE — Assessment & Plan Note (Signed)
Radiology recommends an ultrasound to further assess this incidental thyroid nodule.

## 2023-01-03 NOTE — Progress Notes (Signed)
[facility] [address] [place] [address] Dept: [phone] Dept Fax: [phone]  Chronic Care Office Visit  Subjective:    Patient ID: Kelly Hurst, female    DOB: Dec 22, 1955, 67 y.o..   MRN: [id number]  Chief Complaint  Patient presents with   Medical Management of Chronic Issues    3 month f/u, c/o still having pain in legs.     History of Present Illness:  Patient is in today for reassessment of chronic medical issues.  Ms. Kelly Hurst has a history of hypertension. She is managed on olmesartan/amlodipine/HCTZ 40-10-25 mg daily. She had changed pharmacies and apparently had some difficulty getting the prescriptions sent to the correct pharmacy. As such, she has not been on her medication for quite some time now.  Ms. Kelly Hurst has a history of hyperlipidemia. She is managed on atorvastatin 40 mg daily. She has also been off of this medication.  Ms. Kelly Hurst has a history of lower leg edema. She is manage don furosemide.   Ms. Kelly Hurst has mid back pain. She notes that her neurosurgeon has recommended she have surgery for this. She has been reluctant to proceed with surgery at this point.   Past Medical History: Patient Active Problem List   Diagnosis Date Noted   ASCUS of cervix with negative high risk HPV 10/10/2022   Aortic atherosclerosis ([place]) 10/03/2022   Uterine fibroid 10/03/2022   Diverticulosis 10/03/2022   Multilevel degenerative disc disease 09/08/2022   Essential hypertension 09/04/2022   Hyperlipidemia 09/04/2022   Chronic bilateral low back pain with bilateral sciatica 09/04/2022   Pedal edema 09/04/2022   GERD (gastroesophageal reflux disease) 09/04/2022   Prediabetes 09/04/2022   Past Surgical History:  Procedure Laterality Date   ECTOPIC PREGNANCY SURGERY     [place]   Family History  Problem Relation Age of Onset   Diabetes Mother    Diabetes Father    Diabetes Sister    Diabetes Brother     Outpatient Medications Prior to Visit  Medication Sig Dispense Refill   pantoprazole (PROTONIX) 40 MG tablet Take 1 tablet (40 mg total) by mouth daily. 30 tablet 3   tiZANidine (ZANAFLEX) 4 MG tablet Take 4 mg by mouth 3 (three) times daily.     Turmeric (QC TUMERIC COMPLEX PO) Take by mouth.     Vitamin D, Ergocalciferol, (DRISDOL) 1.25 MG (50000 UNIT) CAPS capsule Take 50,000 Units by mouth once a week.     diclofenac (VOLTAREN) 75 MG EC tablet Take 1 tablet (75 mg total) by mouth 2 (two) times daily. 60 tablet 3   Olmesartan-amLODIPine-HCTZ 40-10-25 MG TABS Take 1 tablet by mouth daily at 12 noon. 90 tablet 3   aspirin EC 81 MG tablet Take 81 mg by mouth daily. Swallow whole. (Patient not taking: Reported on 01/03/2023)     atorvastatin (LIPITOR) 40 MG tablet Take 1 tablet (40 mg total) by mouth daily. (Patient not taking: Reported on 01/03/2023) 90 tablet 3   furosemide (LASIX) 20 MG tablet Take 1 tablet (20 mg total) by mouth daily. (Patient not taking: Reported on 01/03/2023) 30 tablet 5   No facility-administered medications prior to visit.   No Known Allergies Objective:   Today's Vitals   01/03/23 1039  BP: (!) 172/94  Pulse: 83  Temp: 98 F (36.7 C)  TempSrc: Temporal  SpO2: 98%  Weight: 217 lb (98.4 kg)  Height: '5\' 8"'$  (1.727 m)   Body mass index is 32.99 kg/m.  General: Well developed, well nourished. No acute distress. Extremities: 1+ edema noted. Skin: Warm and dry. No rashes. Neuro: CN II-XII intact. Normal sensation and DTR bilaterally. Psych: Alert and oriented. Normal mood and affect.  Health Maintenance Due  Topic Date Due   HIV Screening  Never done   COLONOSCOPY (Pts 45-60yr Insurance coverage will need to be confirmed)  Never done   Zoster Vaccines- Shingrix (1 of 2) Never done   Imaging MR Thoracic Spine wo contrast (12/09/2022) IMPRESSION: 1. Advanced multilevel degenerative changes of the thoracic spine, most pronounced at the T11-T12 level where  there is severe canal stenosis with severe right and moderate left foraminal stenosis. 2. Moderate-to-severe canal stenosis at T10-11 with severe bilateral foraminal stenosis. 3. T2/STIR hyperintense signal at the T10-11 and T11-T12 levels, which may reflect myelomalacia and/or cord edema. 4. Advanced multilevel cervical spondylosis with multilevel canal stenosis spanning from C3-4 to C5-6. There is also canal stenosis at the C7-T1 level. Dedicated cervical spine MRI would be helpful to better assess these findings. 5. Multinodular thyroid gland, largest nodule on the right measures up to 1.7 cm. Recommend thyroid [place](ref: J Am Coll Radiol. 2015 Feb;12(2): 143-50).  MR Lumbar Spine wo contrast (11/08/2022) IMPRESSION: 1. In the partially imaged thoracic spine there are disc bulges and ligamentum flavum hypertrophy at the T10-T11 and T11-T12 level that result in severe spinal canal stenosis with associated cord signal abnormality. There is also likely severe bilateral neural foraminal stenosis at this level. Recommend further evaluation with a thoracic spine MRI. 2. Likely severe spinal canal stenosis at L2-L3 given redundancy of the cauda equina nerve roots at the level above. 3. Moderate spinal canal narrowing at L3-L4 and L5-S1  4. Severe neuroforaminal narrowing at L2-L3 (left), L3-L4 (bilateral), L4-L5 (bilateral), L5-S1 (bilateral). 5. Severe left and moderate right neural foraminal narrowing at this level.    Assessment & Plan:   Problem List Items Addressed This Visit       Cardiovascular and Mediastinum   Essential hypertension - Primary    Blood pressure is quite high today. However, Ms. Kelly Hurst has been off of her medication. I will renew her meds and plan to see her back in 1 month to reassess her BP.      Relevant Medications   atorvastatin (LIPITOR) 40 MG tablet   furosemide (LASIX) 20 MG tablet   Olmesartan-amLODIPine-HCTZ 40-10-25 MG TABS   aspirin EC 81 MG tablet   Aortic  atherosclerosis ([facility])    I will renew her atorvastatin.      Relevant Medications   atorvastatin (LIPITOR) 40 MG tablet   furosemide (LASIX) 20 MG tablet   Olmesartan-amLODIPine-HCTZ 40-10-25 MG TABS   aspirin EC 81 MG tablet     Endocrine   Thyroid nodule greater than or equal to 1.5 cm in diameter incidentally noted on imaging study    Radiology recommends an ultrasound to further assess this incidental thyroid nodule.      Relevant Orders   [place]     Other   Hyperlipidemia    I will renew her atorvastatin.      Relevant Medications   atorvastatin (LIPITOR) 40 MG tablet   furosemide (LASIX) 20 MG tablet   Olmesartan-amLODIPine-HCTZ 40-10-25 MG TABS   aspirin EC 81 MG tablet   Pedal edema    I will renew her furosemide.      Relevant Medications   furosemide (LASIX) 20 MG tablet   Spinal stenosis of thoracolumbar region  Ms. Billingsly has extensive degenerative disease with multiple levels of spinal stenosis. She is considering her surgical options at the current time.      Other Visit Diagnoses     Screening for colon cancer       Relevant Orders   Cologuard      Return in about 4 weeks (around 01/31/2023) for Reassessment.   Haydee Salter, MD

## 2023-01-03 NOTE — Assessment & Plan Note (Signed)
I will renew her furosemide.

## 2023-01-03 NOTE — Assessment & Plan Note (Signed)
Kelly Hurst has extensive degenerative disease with multiple levels of spinal stenosis. She is considering her surgical options at the current time.

## 2023-01-30 ENCOUNTER — Ambulatory Visit: Payer: 59 | Admitting: Family Medicine

## 2023-02-01 DIAGNOSIS — Z1211 Encounter for screening for malignant neoplasm of colon: Secondary | ICD-10-CM | POA: Diagnosis not present

## 2023-02-07 DIAGNOSIS — Z6831 Body mass index (BMI) 31.0-31.9, adult: Secondary | ICD-10-CM | POA: Diagnosis not present

## 2023-02-07 DIAGNOSIS — M4804 Spinal stenosis, thoracic region: Secondary | ICD-10-CM | POA: Diagnosis not present

## 2023-02-07 DIAGNOSIS — M48062 Spinal stenosis, lumbar region with neurogenic claudication: Secondary | ICD-10-CM | POA: Diagnosis not present

## 2023-02-10 DIAGNOSIS — R404 Transient alteration of awareness: Secondary | ICD-10-CM | POA: Diagnosis not present

## 2023-02-10 DIAGNOSIS — I499 Cardiac arrhythmia, unspecified: Secondary | ICD-10-CM | POA: Diagnosis not present

## 2023-02-10 DIAGNOSIS — I469 Cardiac arrest, cause unspecified: Secondary | ICD-10-CM | POA: Diagnosis not present

## 2023-02-10 DIAGNOSIS — R000 Tachycardia, unspecified: Secondary | ICD-10-CM | POA: Diagnosis not present

## 2023-02-12 LAB — COLOGUARD: COLOGUARD: NEGATIVE

## 2023-02-28 DEATH — deceased

## 2023-03-01 ENCOUNTER — Ambulatory Visit: Payer: 59 | Admitting: Family Medicine

## 2023-03-02 ENCOUNTER — Telehealth: Payer: Self-pay | Admitting: Family Medicine

## 2023-03-02 NOTE — Telephone Encounter (Signed)
Pt NS on 5/2 no reason available letter sent

## 2023-03-05 NOTE — Telephone Encounter (Signed)
Noted. Dm/cma  

## 2023-03-23 NOTE — Telephone Encounter (Signed)
1st no show, fee waived, letter sent & text sent  

## 2023-05-22 ENCOUNTER — Telehealth: Payer: Self-pay | Admitting: *Deleted

## 2023-05-22 NOTE — Telephone Encounter (Signed)
I attempted to contact patient by telephone but was unsuccessful. According to the patient's chart they are due for follow up  with [facility]. I have left a HIPAA compliant message advising the patient to contact [facility]  at [phone]. I will continue to follow up with the patient to make sure this appointment is scheduled.

## 2023-05-25 ENCOUNTER — Encounter: Payer: Self-pay | Admitting: *Deleted
